# Patient Record
Sex: Male | Born: 1995 | Race: White | Hispanic: No | Marital: Single | Smoking: Never smoker
Health system: Southern US, Community
[De-identification: ages and names within clinical notes are randomized; demographics above are authoritative.]

## PROBLEM LIST (undated history)

## (undated) DIAGNOSIS — F79 Unspecified intellectual disabilities: Secondary | ICD-10-CM

## (undated) DIAGNOSIS — I517 Cardiomegaly: Secondary | ICD-10-CM

## (undated) DIAGNOSIS — IMO0001 Reserved for inherently not codable concepts without codable children: Secondary | ICD-10-CM

## (undated) HISTORY — PX: CLEFT PALATE REPAIR: SUR1165

## (undated) HISTORY — PX: MOUTH SURGERY: SHX715

---
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
## 2015-03-01 NOTE — H&P (Signed)
HISTORY AND PHYSICAL  Ruben Mack is a 19 y.o. male patient referred by pediatric dentist for removal impacted third molars.  No diagnosis found.  No past medical history on file.  No current facility-administered medications for this encounter.   No current outpatient prescriptions on file.   Allergies not on file Active Problems:   * No active hospital problems. *  Vitals: There were no vitals taken for this visit. Lab results:No results found for this or any previous visit (from the past 24 hour(s)). Radiology Results: No results found. General appearance: alert, cooperative, no distress and slowed mentation Head: Normocephalic, without obvious abnormality, atraumatic Eyes: negative Nose: Nares normal. Septum midline. Mucosa normal. No drainage or sinus tenderness. Throat: lips, mucosa, and tongue normal; teeth and gums normal and impacted teeth # 1, 15, 16, 17, 32 Neck: no adenopathy, supple, symmetrical, trachea midline and thyroid not enlarged, symmetric, no tenderness/mass/nodules Resp: clear to auscultation bilaterally Cardio: regular rate and rhythm, S1, S2 normal, no murmur, click, rub or gallop  Assessment:Impacted teeth # 1, 15, 16, 17, 32. Plan: Extraction impacted teeth # 1, 16, 17, 32. Surgical exposure tooth # 15. General anesthesia. Day surgery.   [name] 03/01/2015

## 2015-03-02 ENCOUNTER — Encounter (HOSPITAL_COMMUNITY): Payer: Self-pay | Admitting: *Deleted

## 2015-03-02 MED ORDER — CEFAZOLIN SODIUM-DEXTROSE 2-3 GM-% IV SOLR
2.0000 g | INTRAVENOUS | Status: AC
  Administered 2015-03-03: 2 g via INTRAVENOUS
  Filled 2015-03-02: qty 50

## 2015-03-02 NOTE — Progress Notes (Signed)
Pt SDW -pre-op call completed by pt mother, Elease Hashimoto [facility] documentation on chart). Mother denies pt having a stress test and cardiac cath but stated that an EKG was done at [facility] in [place] where pt is treated ( physician name unknown); records requested. Pt mother made aware to stop administering  Aspirin, otc vitamins and herbal medications. Do not give any NSAIDs ie: Ibuprofen, Advil, Naproxen or any medication containing Aspirin. Mother verbalized understanding of all pre-op instructions. Pt chart forwarded to [place], [place], anesthesia, to review cardiac history and history of intellectual development delay.

## 2015-03-02 NOTE — Progress Notes (Signed)
Anesthesia Chart Review: SAME DAY WORK-UP.  Patient is a 19 year old male scheduled for wisdom teeth extraction tomorrow by Dr. Barbette Merino.  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  His pediatrician is Dr. Michiel Sites with [facility].  Reported Dr. Barbette Merino spoke with him regarding plans for surgery, and Dr. Eddie Candle preferred procedure be done in a hospital setting.  Cardiologist is Dr. Cristy Folks ([facility]; see Care Everywhere). He sees patient yearly given patient's LVH and strong family history of HCM. Last visit 08/10/14. Patient was doing well at that time.  There was consideration of an eventual referral to a genetic cardiologist "at some point." He did order a holter monitor (see below) that was reassuring. Based on exam findings then, Dr. Meredeth Ide recommended: 1. No changes to usual care are necessary. 2. Self limiting restrictions to sports or activities at this time. 3. Antibiotic prophylaxis is not needed prior to dental procedures.   08/10/14 EKG ([facility]): NSR.  09/2014 24 hour Holter Monitor: Rhythm: Normal sinus rhythm with periods of sinus tachycardia, sinus bradycardia, and sinus arrhythmia. There was one isolated supraventricular ectopic beats. There were no supraventricular or ventricular runs. Heart range and variability: minimum heart rate 42, average heart rate 68, and maximum heart rate of 119 bpm. There were no prolonged pauses and No evidence of AV block. The longest R-R interval was 1.55 s Recommendations: 1. The results are reassuring  2. Follow up has been recommended as previously planned. I'd be happy to see them sooner if needed.  08/10/14 Echo ([facility]; see Care Everywhere): INTERPRETATION SUMMARY No significant change from last echocardiogram.  Normal left ventricular systolic function Normal right ventricular  systolic function Borderline concentric left ventricular hypertrophy. The left ventricle appears more thickened toward the apex with trabeculations.  06/06/10 Cardiac MRI ([facility]; See Care Everywhere): FINAL IMPRESSION: Normal LV function and volume. Top normal to mild concentric left ventricular hypertrophy. No evidence of ischemia on stress perfusion imaging. No evidence of dynamic LV outflow tract obstruction. The pulse sequences used were designed for imaging cardiovascular structures and are suboptimal for imaging other structures and organs.  Above history reviewed with anesthesiologist Dr. Katrinka Blazing. Patient has had cardiology follow-up with testing within the past year that were stable.  If no acute changes it is anticipated that he can proceed as planned.  Velna Ochs [facility] Short Stay Center/Anesthesiology Phone [phone] 03/02/2015 4:56 PM

## 2015-03-03 ENCOUNTER — Ambulatory Visit (HOSPITAL_COMMUNITY): Payer: Medicaid Other | Admitting: Vascular Surgery

## 2015-03-03 ENCOUNTER — Encounter (HOSPITAL_COMMUNITY)
Admission: RE | Disposition: A | Discharge status: Home / Self Care | Payer: Self-pay | Source: Ambulatory Visit | Attending: Oral Surgery

## 2015-03-03 ENCOUNTER — Encounter (HOSPITAL_COMMUNITY): Payer: Self-pay

## 2015-03-03 ENCOUNTER — Ambulatory Visit (HOSPITAL_COMMUNITY)
Admission: RE | Admit: 2015-03-03 | Discharge: 2015-03-03 | Disposition: A | Discharge status: Home / Self Care | Payer: Medicaid Other | Source: Ambulatory Visit | Attending: Oral Surgery | Admitting: Oral Surgery

## 2015-03-03 DIAGNOSIS — K011 Impacted teeth: Secondary | ICD-10-CM | POA: Insufficient documentation

## 2015-03-03 DIAGNOSIS — F840 Autistic disorder: Secondary | ICD-10-CM | POA: Diagnosis not present

## 2015-03-03 DIAGNOSIS — R6250 Unspecified lack of expected normal physiological development in childhood: Secondary | ICD-10-CM | POA: Insufficient documentation

## 2015-03-03 HISTORY — DX: Unspecified intellectual disabilities: F79

## 2015-03-03 HISTORY — DX: Reserved for inherently not codable concepts without codable children: IMO0001

## 2015-03-03 HISTORY — PX: TOOTH EXTRACTION: SHX859

## 2015-03-03 HISTORY — DX: Cardiomegaly: I51.7

## 2015-03-03 LAB — BASIC METABOLIC PANEL
Anion gap: 12 (ref 5–15)
BUN: <5 mg/dL — ABNORMAL LOW (ref 6–20)
CO2: 20 mmol/L — ABNORMAL LOW (ref 22–32)
Calcium: 9.4 mg/dL (ref 8.9–10.3)
Chloride: 103 mmol/L (ref 101–111)
Creatinine, Ser: 0.64 mg/dL (ref 0.61–1.24)
GFR calc non Af Amer: >60 mL/min (ref >60)
Glucose, Bld: 106 mg/dL — ABNORMAL HIGH (ref 65–99)
Potassium: 3.8 mmol/L (ref 3.5–5.1)
Sodium: 135 mmol/L (ref 135–145)

## 2015-03-03 LAB — CBC
HCT: 41.5 % (ref 39.0–52.0)
HEMOGLOBIN: 14.3 g/dL (ref 13.0–17.0)
MCH: 30 pg (ref 26.0–34.0)
MCHC: 34.5 g/dL (ref 30.0–36.0)
MCV: 87.2 fL (ref 78.0–100.0)
PLATELETS: 254 10*3/uL (ref 150–400)
RBC: 4.76 MIL/uL (ref 4.22–5.81)
RDW: 14.2 % (ref 11.5–15.5)
WBC: 6.6 10*3/uL (ref 4.0–10.5)

## 2015-03-03 SURGERY — DENTAL RESTORATION/EXTRACTIONS
Anesthesia: General | Site: Mouth

## 2015-03-03 MED ORDER — PROMETHAZINE HCL 25 MG/ML IJ SOLN: 6.2500 mg | INTRAMUSCULAR | Status: DC | PRN

## 2015-03-03 MED ORDER — FENTANYL CITRATE (PF) 100 MCG/2ML IJ SOLN
INTRAMUSCULAR | Status: DC | PRN
  Administered 2015-03-03 (×2): 50 ug via INTRAVENOUS

## 2015-03-03 MED ORDER — OXYCODONE-ACETAMINOPHEN 5-325 MG PO TABS: 1.0000 | ORAL_TABLET | ORAL | Status: DC | PRN

## 2015-03-03 MED ORDER — SUCCINYLCHOLINE CHLORIDE 20 MG/ML IJ SOLN
INTRAMUSCULAR | Status: DC | PRN
  Administered 2015-03-03: 100 mg via INTRAVENOUS

## 2015-03-03 MED ORDER — 0.9 % SODIUM CHLORIDE (POUR BTL) OPTIME
TOPICAL | Status: DC | PRN
  Administered 2015-03-03: 1000 mL

## 2015-03-03 MED ORDER — LIDOCAINE-EPINEPHRINE 2 %-1:100000 IJ SOLN
INTRAMUSCULAR | Status: AC
  Filled 2015-03-03: qty 1

## 2015-03-03 MED ORDER — SODIUM CHLORIDE 0.9 % IR SOLN
Status: DC | PRN
  Administered 2015-03-03: 1000 mL

## 2015-03-03 MED ORDER — MIDAZOLAM HCL 2 MG/2ML IJ SOLN
INTRAMUSCULAR | Status: AC
  Filled 2015-03-03: qty 2

## 2015-03-03 MED ORDER — LIDOCAINE-EPINEPHRINE 2 %-1:100000 IJ SOLN
INTRAMUSCULAR | Status: DC | PRN
  Administered 2015-03-03: 15 mL

## 2015-03-03 MED ORDER — FENTANYL CITRATE (PF) 250 MCG/5ML IJ SOLN
INTRAMUSCULAR | Status: AC
  Filled 2015-03-03: qty 5

## 2015-03-03 MED ORDER — PROPOFOL 10 MG/ML IV BOLUS
INTRAVENOUS | Status: AC
  Filled 2015-03-03: qty 20

## 2015-03-03 MED ORDER — PROPOFOL 10 MG/ML IV BOLUS
INTRAVENOUS | Status: DC | PRN
  Administered 2015-03-03: 200 mg via INTRAVENOUS

## 2015-03-03 MED ORDER — MIDAZOLAM HCL 2 MG/ML PO SYRP
12.0000 mg | ORAL_SOLUTION | Freq: Once | ORAL | Status: AC
  Administered 2015-03-03: 12 mg via ORAL

## 2015-03-03 MED ORDER — ONDANSETRON HCL 4 MG/2ML IJ SOLN
INTRAMUSCULAR | Status: DC | PRN
  Administered 2015-03-03: 4 mg via INTRAVENOUS

## 2015-03-03 MED ORDER — LIDOCAINE HCL (CARDIAC) 20 MG/ML IV SOLN
INTRAVENOUS | Status: DC | PRN
  Administered 2015-03-03: 50 mg via INTRAVENOUS

## 2015-03-03 MED ORDER — MIDAZOLAM HCL 2 MG/ML PO SYRP
ORAL_SOLUTION | ORAL | Status: AC
  Filled 2015-03-03: qty 6

## 2015-03-03 MED ORDER — HYDROMORPHONE HCL 1 MG/ML IJ SOLN: 0.2500 mg | INTRAMUSCULAR | Status: DC | PRN

## 2015-03-03 MED ORDER — DEXAMETHASONE SODIUM PHOSPHATE 4 MG/ML IJ SOLN
INTRAMUSCULAR | Status: DC | PRN
  Administered 2015-03-03: 8 mg via INTRAVENOUS

## 2015-03-03 MED ORDER — LACTATED RINGERS IV SOLN
INTRAVENOUS | Status: DC
Start: 2015-03-03 — End: 2015-03-03
  Administered 2015-03-03: 07:00:00 via INTRAVENOUS

## 2015-03-03 SURGICAL SUPPLY — 29 items
BLADE 10 SAFETY STRL DISP (BLADE) ×3 IMPLANT
BUR CROSS CUT FISSURE 1.6 (BURR) ×1 IMPLANT
BUR CROSS CUT FISSURE 1.6MM (BURR) ×1
BUR EGG ELITE 4.0 (BURR) ×1 IMPLANT
BUR EGG ELITE 4.0MM (BURR)
CANISTER SUCTION 2500CC (MISCELLANEOUS) ×3 IMPLANT
COVER SURGICAL LIGHT HANDLE (MISCELLANEOUS) ×3 IMPLANT
GAUZE PACKING FOLDED 2  STR (GAUZE/BANDAGES/DRESSINGS) ×2
GAUZE PACKING FOLDED 2 STR (GAUZE/BANDAGES/DRESSINGS) ×1 IMPLANT
GLOVE BIO SURGEON STRL SZ 6.5 (GLOVE) ×3 IMPLANT
GLOVE BIO SURGEON STRL SZ7.5 (GLOVE) ×7 IMPLANT
GLOVE BIO SURGEONS STRL SZ 6.5 (GLOVE) ×2
GLOVE BIOGEL PI IND STRL 7.0 (GLOVE) ×1 IMPLANT
GLOVE BIOGEL PI INDICATOR 7.0 (GLOVE) ×4
GOWN STRL REUS W/ TWL LRG LVL3 (GOWN DISPOSABLE) ×1 IMPLANT
GOWN STRL REUS W/ TWL XL LVL3 (GOWN DISPOSABLE) ×1 IMPLANT
GOWN STRL REUS W/TWL LRG LVL3 (GOWN DISPOSABLE) ×6
GOWN STRL REUS W/TWL XL LVL3 (GOWN DISPOSABLE) ×3
KIT BASIN OR (CUSTOM PROCEDURE TRAY) ×3 IMPLANT
KIT ROOM TURNOVER OR (KITS) ×3 IMPLANT
NEEDLE 22X1 1/2 (OR ONLY) (NEEDLE) ×3 IMPLANT
NS IRRIG 1000ML POUR BTL (IV SOLUTION) ×3 IMPLANT
PAD ARMBOARD 7.5X6 YLW CONV (MISCELLANEOUS) ×3 IMPLANT
SPONGE SURGIFOAM ABS GEL 12-7 (HEMOSTASIS) IMPLANT
SUT CHROMIC 3 0 PS 2 (SUTURE) ×2 IMPLANT
TOWEL OR 17X24 6PK STRL BLUE (TOWEL DISPOSABLE) ×3 IMPLANT
TRAY ENT MC OR (CUSTOM PROCEDURE TRAY) ×3 IMPLANT
TUBING IRRIGATION (MISCELLANEOUS) ×3 IMPLANT
YANKAUER SUCT BULB TIP NO VENT (SUCTIONS) ×3 IMPLANT

## 2015-03-03 NOTE — Anesthesia Preprocedure Evaluation (Addendum)
Anesthesia Evaluation  Patient identified by MRN, date of birth, ID band Patient awake    Reviewed: Allergy & Precautions, NPO status , Patient's Chart, lab work & pertinent test results  Airway Mallampati: II  TM Distance: >3 FB Neck ROM: Full    Dental  (+) Dental Advisory Given, Poor Dentition   Pulmonary shortness of breath and with exertion,    Pulmonary exam normal       Cardiovascular Normal cardiovascular exam Cardiomyopathy   Neuro/Psych Mental Disability negative psych ROS   GI/Hepatic negative GI ROS, Neg liver ROS,   Endo/Other  negative endocrine ROS  Renal/GU negative Renal ROS  negative genitourinary   Musculoskeletal negative musculoskeletal ROS (+)   Abdominal   Peds negative pediatric ROS (+)  Hematology negative hematology ROS (+)   Anesthesia Other Findings   Reproductive/Obstetrics negative OB ROS                            Anesthesia Physical Anesthesia Plan  ASA: II  Anesthesia Plan: General   Post-op Pain Management:    Induction: Intravenous  Airway Management Planned: Oral ETT  Additional Equipment:   Intra-op Plan:   Post-operative Plan: Extubation in OR  Informed Consent: I have reviewed the patients History and Physical, chart, labs and discussed the procedure including the risks, benefits and alternatives for the proposed anesthesia with the patient or authorized representative who has indicated his/her understanding and acceptance.   Dental advisory given  Plan Discussed with:   Anesthesia Plan Comments:        Anesthesia Quick Evaluation

## 2015-03-03 NOTE — Transfer of Care (Signed)
Immediate Anesthesia Transfer of Care Note  Patient: Ruben Mack  Procedure(s) Performed: Procedure(s): WISDOM EXTRACTIONS (N/A)  Patient Location: PACU  Anesthesia Type:General  Level of Consciousness: awake, alert , oriented, patient cooperative and responds to stimulation  Airway & Oxygen Therapy: Patient Spontanous Breathing and Patient connected to nasal cannula oxygen  Post-op Assessment: Report given to RN, Post -op Vital signs reviewed and stable, Patient moving all extremities X 4 and Patient able to stick tongue midline  Post vital signs: stable  Last Vitals:  Filed Vitals:   03/03/15 0637  BP: 126/69  Pulse: 81  Temp: 36.4 C  Resp: 20    Complications: No apparent anesthesia complications

## 2015-03-03 NOTE — Anesthesia Postprocedure Evaluation (Signed)
Anesthesia Post Note  Patient: Ruben Mack  Procedure(s) Performed: Procedure(s) (LRB): WISDOM EXTRACTIONS (N/A)  Anesthesia type: general  Patient location: PACU  Post pain: Pain level controlled  Post assessment: Patient's Cardiovascular Status Stable  Last Vitals:  Filed Vitals:   03/03/15 1015  BP: 132/88  Pulse: 91  Temp:   Resp: 27    Post vital signs: Reviewed and stable  Level of consciousness: sedated  Complications: No apparent anesthesia complications

## 2015-03-03 NOTE — Op Note (Signed)
03/03/2015  9:34 AM  PATIENT:  Ruben Mack  19 y.o. male  PRE-OPERATIVE DIAGNOSIS:   IMPACTED TEETH # 1, 16, 17, 32  POST-OPERATIVE DIAGNOSIS:  SAME  PROCEDURE:  Procedure(s): EXTRACTION TEETH # 1, 16, 17, 32:   SURGEON:  Surgeon(s): Press photographer, DDS  ANESTHESIA:   local and general  EBL:  minimal  DRAINS: none   SPECIMEN:  No Specimen  COUNTS:  YES  PLAN OF CARE: Discharge to home after PACU  PATIENT DISPOSITION:  PACU - hemodynamically stable.   PROCEDURE DETAILS: Dictation # [id number] [name], DMD 03/03/2015 9:34 AM

## 2015-03-03 NOTE — H&P (Signed)
Anesthesia H&P Update: History and Physical Exam reviewed; patient is OK for planned anesthetic and procedure. ? ?

## 2015-03-03 NOTE — Anesthesia Procedure Notes (Signed)
Procedure Name: Intubation Date/Time: 03/03/2015 8:57 AM Performed by: Marylyn Ishihara Pre-anesthesia Checklist: Patient identified, Timeout performed, Emergency Drugs available, Suction available and Patient being monitored Patient Re-evaluated:Patient Re-evaluated prior to inductionOxygen Delivery Method: Circle system utilized Preoxygenation: Pre-oxygenation with 100% oxygen Intubation Type: IV induction Ventilation: Mask ventilation without difficulty Laryngoscope Size: Mac and 3 Tube type: Oral Tube size: 7.0 mm Number of attempts: 1 Airway Equipment and Method: Stylet Placement Confirmation: ETT inserted through vocal cords under direct vision,  breath sounds checked- equal and bilateral and positive ETCO2 Secured at: 20.5 cm Tube secured with: Tape Dental Injury: Teeth and Oropharynx as per pre-operative assessment

## 2015-03-03 NOTE — H&P (Signed)
H&P documentation  -History and Physical Reviewed  -Patient has been re-examined  -No change in the plan of care  Ruben Mack M  

## 2015-03-04 NOTE — Op Note (Signed)
NAME:  Ruben Mack, Ruben Mack NO.:  [id number]  MEDICAL RECORD NO.:  [id number]  LOCATION:  [facility]                         FACILITY:  [facility]  PHYSICIAN:  [name], M.D.  DATE OF BIRTH:  1996-01-11  DATE OF PROCEDURE:  03/03/2015 DATE OF DISCHARGE:  03/03/2015                              OPERATIVE REPORT   PREOPERATIVE DIAGNOSIS:  Impacted wisdom teeth numbers 1, 16, 17, 32.  POSTOPERATIVE DIAGNOSIS:  Impacted wisdom teeth numbers 1, 16, 17, 32.  PROCEDURE:  Extraction of teeth numbers 1, 16, 17, 32.  Exposure impacted tooth #15.  SURGEON:  [name], M.D.  ANESTHESIA:  General.  Dr. Krista Blue attending.  Nasal intubation.  DESCRIPTION OF PROCEDURE:  The patient was taken to the operating room, placed on the table in supine position.  General anesthesia was administered and an oral endotracheal tube was placed and secured.  The eyes were protected and the patient was draped for the procedure.  Time- out was performed.  The posterior pharynx was suctioned.  Throat pack was placed.  A 2% lidocaine with 1:100,000 epinephrine was infiltrated in an inferior alveolar block on the right and left side and buccal and palatal infiltration in the maxilla in the posterior aspect.  A total of 12 mL was utilized.  The left side was operated first.  A  #15 blade was used to make an incision overlying tooth #17 and 16 sparing the papilla and both the maxilla and mandible on the medial aspect of the incision. The periosteum was reflected with a periosteal elevator.  Bone was removed with a Stryker handpiece in the mandible and tooth #17 was sectioned and removed with a 301 elevator and the socket was then curetted and closed with 3-0 chromic and the maxilla tooth #16 was removed.  It was found to be partially in the bone and was preventing interruption of unerupted tooth #15.  Tooth #15 was elevated gently with a 301 elevator and then the area was curetted and closed with  3-0 chromic.  Then, the endotracheal tube was repositioned to the other side of the mouth; however, upon doing this, the patient was inadvertently extubated.  This was recognized by the CRNA and then the throat pack was removed and the endotracheal tube was removed.  The patient was reintubated.  His O2 sat did not drop below 90 throughout this event. The patient being reintubated and the operation continued.  Right side was operated using a #15 blade to make an incision overlying teeth numbers 1 and 32.  The periosteum was reflected and then bone was removed using the Stryker handpiece overlying tooth #32 and the tooth was sectioned and removed with a 301 elevator.  The sockets were curetted, irrigated and closed with 3-0 chromic.  At tooth #16 was removed using the Cryer elevators after bone was removed using Stryker handpiece.  Then, this socket was curetted, irrigated, and closed with 3- 0 chromic.  The oral cavity was then suctioned.  After irrigating, the throat pack was removed.  The patient was awakened and taken to the recovery room, breathing spontaneously in good condition.  ESTIMATED BLOOD LOSS:  Minimal.  COMPLICATIONS:  None.     [name], M.D.     SMJ/MEDQ  D:  03/03/2015  T:  03/04/2015  Job:  [id number]

## 2015-03-06 ENCOUNTER — Encounter (HOSPITAL_COMMUNITY): Payer: Self-pay | Admitting: Oral Surgery

## 2015-08-21 ENCOUNTER — Emergency Department (INDEPENDENT_AMBULATORY_CARE_PROVIDER_SITE_OTHER)
Admission: EM | Admit: 2015-08-21 | Discharge: 2015-08-21 | Disposition: A | Discharge status: Home / Self Care | Payer: Medicaid Other | Source: Home / Self Care | Attending: Family Medicine | Admitting: Family Medicine

## 2015-08-21 ENCOUNTER — Encounter (HOSPITAL_COMMUNITY): Payer: Self-pay | Admitting: Emergency Medicine

## 2015-08-21 DIAGNOSIS — R05 Cough: Secondary | ICD-10-CM | POA: Diagnosis not present

## 2015-08-21 DIAGNOSIS — J3489 Other specified disorders of nose and nasal sinuses: Secondary | ICD-10-CM | POA: Diagnosis not present

## 2015-08-21 DIAGNOSIS — J069 Acute upper respiratory infection, unspecified: Secondary | ICD-10-CM

## 2015-08-21 DIAGNOSIS — R059 Cough, unspecified: Secondary | ICD-10-CM

## 2015-08-21 LAB — POCT RAPID STREP A: STREPTOCOCCUS, GROUP A SCREEN (DIRECT): NEGATIVE

## 2015-08-21 NOTE — ED Notes (Signed)
Sore throat and chest congestion for 2 days.  Caregiver with similar symptoms.  Guaifenesin and tylenol taking for symptoms.

## 2015-08-21 NOTE — Discharge Instructions (Signed)
Cough, Adult The primary reason for your cough is the drainage in the back of your throat. Treatment for that includes increasing her fluid intake and taking in any histamine. Would recommend taking either Allegra or Zyrtec for drainage. You may also take Robitussin-DM for cough. Coughing is a reflex that clears your throat and your airways. Coughing helps to heal and protect your lungs. It is normal to cough occasionally, but a cough that happens with other symptoms or lasts a long time may be a sign of a condition that needs treatment. A cough may last only 2-3 weeks (acute), or it may last longer than 8 weeks (chronic). CAUSES Coughing is commonly caused by:  Breathing in substances that irritate your lungs.  A viral or bacterial respiratory infection.  Allergies.  Asthma.  Postnasal drip.  Smoking.  Acid backing up from the stomach into the esophagus (gastroesophageal reflux).  Certain medicines.  Chronic lung problems, including COPD (or rarely, lung cancer).  Other medical conditions such as heart failure. HOME CARE INSTRUCTIONS  Pay attention to any changes in your symptoms. Take these actions to help with your discomfort:  Take medicines only as told by your health care provider.  If you were prescribed an antibiotic medicine, take it as told by your health care provider. Do not stop taking the antibiotic even if you start to feel better.  Talk with your health care provider before you take a cough suppressant medicine.  Drink enough fluid to keep your urine clear or pale yellow.  If the air is dry, use a cold steam vaporizer or humidifier in your bedroom or your home to help loosen secretions.  Avoid anything that causes you to cough at work or at home.  If your cough is worse at night, try sleeping in a semi-upright position.  Avoid cigarette smoke. If you smoke, quit smoking. If you need help quitting, ask your health care provider.  Avoid caffeine.  Avoid  alcohol.  Rest as needed. SEEK MEDICAL CARE IF:   You have new symptoms.  You cough up pus.  Your cough does not get better after 2-3 weeks, or your cough gets worse.  You cannot control your cough with suppressant medicines and you are losing sleep.  You develop pain that is getting worse or pain that is not controlled with pain medicines.  You have a fever.  You have unexplained weight loss.  You have night sweats. SEEK IMMEDIATE MEDICAL CARE IF:  You cough up blood.  You have difficulty breathing.  Your heartbeat is very fast.   This information is not intended to replace advice given to you by your health care provider. Make sure you discuss any questions you have with your health care provider.   Document Released: 02/01/2011 Document Revised: 04/26/2015 Document Reviewed: 10/12/2014 Elsevier Interactive Patient Education 2016 [employer]  Upper Respiratory Infection, Adult Most upper respiratory infections (URIs) are a viral infection of the air passages leading to the lungs. A URI affects the nose, throat, and upper air passages. The most common type of URI is nasopharyngitis and is typically referred to as "the common cold." URIs run their course and usually go away on their own. Most of the time, a URI does not require medical attention, but sometimes a bacterial infection in the upper airways can follow a viral infection. This is called a secondary infection. Sinus and middle ear infections are common types of secondary upper respiratory infections. Bacterial pneumonia can also complicate a URI. A  URI can worsen asthma and chronic obstructive pulmonary disease (COPD). Sometimes, these complications can require emergency medical care and may be life threatening.  CAUSES Almost all URIs are caused by viruses. A virus is a type of germ and can spread from one person to another.  RISKS FACTORS You may be at risk for a URI if:   You smoke.   You have chronic heart  or lung disease.  You have a weakened defense (immune) system.   You are very young or very old.   You have nasal allergies or asthma.  You work in crowded or poorly ventilated areas.  You work in health care facilities or schools. SIGNS AND SYMPTOMS  Symptoms typically develop 2-3 days after you come in contact with a cold virus. Most viral URIs last 7-10 days. However, viral URIs from the influenza virus (flu virus) can last 14-18 days and are typically more severe. Symptoms may include:   Runny or stuffy (congested) nose.   Sneezing.   Cough.   Sore throat.   Headache.   Fatigue.   Fever.   Loss of appetite.   Pain in your forehead, behind your eyes, and over your cheekbones (sinus pain).  Muscle aches.  DIAGNOSIS  Your health care provider may diagnose a URI by:  Physical exam.  Tests to check that your symptoms are not due to another condition such as:  Strep throat.  Sinusitis.  Pneumonia.  Asthma. TREATMENT  A URI goes away on its own with time. It cannot be cured with medicines, but medicines may be prescribed or recommended to relieve symptoms. Medicines may help:  Reduce your fever.  Reduce your cough.  Relieve nasal congestion. HOME CARE INSTRUCTIONS   Take medicines only as directed by your health care provider.   Gargle warm saltwater or take cough drops to comfort your throat as directed by your health care provider.  Use a warm mist humidifier or inhale steam from a shower to increase air moisture. This may make it easier to breathe.  Drink enough fluid to keep your urine clear or pale yellow.   Eat soups and other clear broths and maintain good nutrition.   Rest as needed.   Return to work when your temperature has returned to normal or as your health care provider advises. You may need to stay home longer to avoid infecting others. You can also use a face mask and careful hand washing to prevent spread of the  virus.  Increase the usage of your inhaler if you have asthma.   Do not use any tobacco products, including cigarettes, chewing tobacco, or electronic cigarettes. If you need help quitting, ask your health care provider. PREVENTION  The best way to protect yourself from getting a cold is to practice good hygiene.   Avoid oral or hand contact with people with cold symptoms.   Wash your hands often if contact occurs.  There is no clear evidence that vitamin C, vitamin E, echinacea, or exercise reduces the chance of developing a cold. However, it is always recommended to get plenty of rest, exercise, and practice good nutrition.  SEEK MEDICAL CARE IF:   You are getting worse rather than better.   Your symptoms are not controlled by medicine.   You have chills.  You have worsening shortness of breath.  You have brown or red mucus.  You have yellow or brown nasal discharge.  You have pain in your face, especially when you bend forward.  You  have a fever.  You have swollen neck glands.  You have pain while swallowing.  You have white areas in the back of your throat. SEEK IMMEDIATE MEDICAL CARE IF:   You have severe or persistent:  Headache.  Ear pain.  Sinus pain.  Chest pain.  You have chronic lung disease and any of the following:  Wheezing.  Prolonged cough.  Coughing up blood.  A change in your usual mucus.  You have a stiff neck.  You have changes in your:  Vision.  Hearing.  Thinking.  Mood. MAKE SURE YOU:   Understand these instructions.  Will watch your condition.  Will get help right away if you are not doing well or get worse.   This information is not intended to replace advice given to you by your health care provider. Make sure you discuss any questions you have with your health care provider.   Document Released: 01/29/2001 Document Revised: 12/20/2014 Document Reviewed: 11/10/2013 Elsevier Interactive Patient Education  [employer]

## 2015-08-21 NOTE — ED Provider Notes (Signed)
CSN: [id number]     Arrival date & time 08/21/15  1309 History   First MD Initiated Contact with Patient 08/21/15 1437     Chief Complaint  Patient presents with  . Sore Throat   (Consider location/radiation/quality/duration/timing/severity/associated sxs/prior Treatment) HPI Comments: 20 year old male complaining of sore throat, cough, runny nose for 2-3 days. Denies PND or fever. He has been taking OTC antihistamines with minimal relief. He and his mother are being seen together and are requesting strep test. These tests are negative.   Past Medical History  Diagnosis Date  . 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 Cristy Folks ([facility])  . Mental disability     intellectual  . Shortness of breath dyspnea     uses inhaler   Past Surgical History  Procedure Laterality Date  . Mouth surgery      palate adjustment  . Tooth extraction N/A 03/03/2015    Procedure: WISDOM EXTRACTIONS;  Surgeon: Ocie Doyne, DDS;  Location: [facility] OR;  Service: Oral Surgery;  Laterality: N/A;   Family History  Problem Relation Age of Onset  . Cancer Other    Social History  Substance Use Topics  . Smoking status: Never Smoker   . Smokeless tobacco: Never Used  . Alcohol Use: No    Review of Systems  Constitutional: Negative for fever, activity change and fatigue.  HENT: Positive for congestion and sore throat. Negative for ear pain and hearing loss.   Respiratory: Positive for cough. Negative for shortness of breath.   Cardiovascular: Negative.   Gastrointestinal: Negative.   Musculoskeletal: Negative.   Skin: Negative.     Allergies  Review of patient's allergies indicates no known allergies.  Home Medications   Prior to Admission medications   Medication Sig Start Date End Date Taking? Authorizing Provider  acetaminophen (TYLENOL) 325 MG tablet Take 650 mg by mouth every 6 (six) hours as needed.    Yes Historical Provider, MD  GUAIFENESIN PO Take by mouth.   Yes Historical Provider, MD  cloNIDine (CATAPRES) 0.2 MG tablet Take 0.2 mg by mouth every evening.    Historical Provider, MD  oxyCODONE-acetaminophen (PERCOCET) 5-325 MG per tablet Take 1-2 tablets by mouth every 4 (four) hours as needed. 03/03/15   Ocie Doyne, DDS   Meds Ordered and Administered this Visit  Medications - No data to display  BP 130/82 mmHg  Pulse 74  Temp(Src) 98.2 F (36.8 C) (Oral)  Resp 18  SpO2 100% No data found.   Physical Exam  Constitutional: He is oriented to person, place, and time. He appears well-developed and well-nourished. No distress.  HENT:  Mouth/Throat: No oropharyngeal exudate.  Bilateral TMs are primarily obscured by cerumen. Left TM is normal-appearing. Oropharynx with minor erythema, cobblestoning and moderate amount of clear PND.  Eyes: Conjunctivae and EOM are normal.  Neck: Normal range of motion. Neck supple.  Cardiovascular: Normal rate, regular rhythm and normal heart sounds.   Pulmonary/Chest: Effort normal and breath sounds normal. No respiratory distress. He has no wheezes. He has no rales.  Musculoskeletal: Normal range of motion. He exhibits no edema.  Lymphadenopathy:    He has no cervical adenopathy.  Neurological: He is alert and oriented to person, place, and time.  Skin: Skin is warm and dry. No rash noted.  Psychiatric: He has a normal mood and affect.  Nursing note and vitals reviewed.   ED Course  Procedures (  including critical care time)  Labs Review Labs Reviewed  POCT RAPID STREP A   Results for orders placed or performed during the hospital encounter of 08/21/15  POCT rapid strep A [facility])  Result Value Ref Range   Streptococcus, Group A Screen (Direct) NEGATIVE NEGATIVE     Imaging Review No results found.   Visual Acuity Review  Right Eye Distance:   Left Eye Distance:   Bilateral Distance:    Right Eye Near:   Left Eye  Near:    Bilateral Near:         MDM   1. URI (upper respiratory infection)   2. Sinus drainage   3. Cough    The primary reason for your cough is the drainage in the back of your throat. Treatment for that includes increasing her fluid intake and taking in any histamine. Would recommend taking either Allegra or Zyrtec for drainage. You may also take Robitussin-DM for cough.     Hayden Rasmussen, NP 08/21/15 1505  Hayden Rasmussen, NP 08/21/15 [phone]

## 2015-08-21 NOTE — ED Notes (Signed)
Caregiver and patient being treated in the same treatment room

## 2015-08-23 LAB — CULTURE, GROUP A STREP: Strep A Culture: NEGATIVE

## 2015-10-09 ENCOUNTER — Emergency Department (INDEPENDENT_AMBULATORY_CARE_PROVIDER_SITE_OTHER)
Admission: EM | Admit: 2015-10-09 | Discharge: 2015-10-09 | Disposition: A | Discharge status: Home / Self Care | Payer: Medicaid Other | Source: Home / Self Care | Attending: Family Medicine | Admitting: Family Medicine

## 2015-10-09 ENCOUNTER — Encounter (HOSPITAL_COMMUNITY): Payer: Self-pay | Admitting: Emergency Medicine

## 2015-10-09 DIAGNOSIS — J069 Acute upper respiratory infection, unspecified: Secondary | ICD-10-CM

## 2015-10-09 NOTE — Discharge Instructions (Signed)
Upper Respiratory Infection, Adult Most upper respiratory infections (URIs) are a viral infection of the air passages leading to the lungs. A URI affects the nose, throat, and upper air passages. The most common type of URI is nasopharyngitis and is typically referred to as "the common cold." URIs run their course and usually go away on their own. Most of the time, a URI does not require medical attention, but sometimes a bacterial infection in the upper airways can follow a viral infection. This is called a secondary infection. Sinus and middle ear infections are common types of secondary upper respiratory infections. Bacterial pneumonia can also complicate a URI. A URI can worsen asthma and chronic obstructive pulmonary disease (COPD). Sometimes, these complications can require emergency medical care and may be life threatening.  CAUSES Almost all URIs are caused by viruses. A virus is a type of germ and can spread from one person to another.  RISKS FACTORS You may be at risk for a URI if:   You smoke.   You have chronic heart or lung disease.  You have a weakened defense (immune) system.   You are very young or very old.   You have nasal allergies or asthma.  You work in crowded or poorly ventilated areas.  You work in health care facilities or schools. SIGNS AND SYMPTOMS  Symptoms typically develop 2-3 days after you come in contact with a cold virus. Most viral URIs last 7-10 days. However, viral URIs from the influenza virus (flu virus) can last 14-18 days and are typically more severe. Symptoms may include:   Runny or stuffy (congested) nose.   Sneezing.   Cough.   Sore throat.   Headache.   Fatigue.   Fever.   Loss of appetite.   Pain in your forehead, behind your eyes, and over your cheekbones (sinus pain).  Muscle aches.  DIAGNOSIS  Your health care provider may diagnose a URI by:  Physical exam.  Tests to check that your symptoms are not due to  another condition such as:  Strep throat.  Sinusitis.  Pneumonia.  Asthma. TREATMENT  A URI goes away on its own with time. It cannot be cured with medicines, but medicines may be prescribed or recommended to relieve symptoms. Medicines may help:  Reduce your fever.  Reduce your cough.  Relieve nasal congestion. HOME CARE INSTRUCTIONS   Take medicines only as directed by your health care provider.   Gargle warm saltwater or take cough drops to comfort your throat as directed by your health care provider.  Use a warm mist humidifier or inhale steam from a shower to increase air moisture. This may make it easier to breathe.  Drink enough fluid to keep your urine clear or pale yellow.   Eat soups and other clear broths and maintain good nutrition.   Rest as needed.   Return to work when your temperature has returned to normal or as your health care provider advises. You may need to stay home longer to avoid infecting others. You can also use a face mask and careful hand washing to prevent spread of the virus.  Increase the usage of your inhaler if you have asthma.   Do not use any tobacco products, including cigarettes, chewing tobacco, or electronic cigarettes. If you need help quitting, ask your health care provider. PREVENTION  The best way to protect yourself from getting a cold is to practice good hygiene.   Avoid oral or hand contact with people with cold   symptoms.   Wash your hands often if contact occurs.  There is no clear evidence that vitamin C, vitamin E, echinacea, or exercise reduces the chance of developing a cold. However, it is always recommended to get plenty of rest, exercise, and practice good nutrition.  SEEK MEDICAL CARE IF:   You are getting worse rather than better.   Your symptoms are not controlled by medicine.   You have chills.  You have worsening shortness of breath.  You have brown or red mucus.  You have yellow or brown nasal  discharge.  You have pain in your face, especially when you bend forward.  You have a fever.  You have swollen neck glands.  You have pain while swallowing.  You have white areas in the back of your throat. SEEK IMMEDIATE MEDICAL CARE IF:   You have severe or persistent:  Headache.  Ear pain.  Sinus pain.  Chest pain.  You have chronic lung disease and any of the following:  Wheezing.  Prolonged cough.  Coughing up blood.  A change in your usual mucus.  You have a stiff neck.  You have changes in your:  Vision.  Hearing.  Thinking.  Mood. MAKE SURE YOU:   Understand these instructions.  Will watch your condition.  Will get help right away if you are not doing well or get worse.   This information is not intended to replace advice given to you by your health care provider. Make sure you discuss any questions you have with your health care provider.   Document Released: 01/29/2001 Document Revised: 12/20/2014 Document Reviewed: 11/10/2013 Elsevier Interactive Patient Education 2016 [employer] Inc[employer]  

## 2015-10-09 NOTE — ED Provider Notes (Signed)
CSN: [id number]     Arrival date & time 10/09/15  1631 History   First MD Initiated Contact with Patient 10/09/15 1852     Chief Complaint  Patient presents with  . Cough  . Nasal Congestion   (Consider location/radiation/quality/duration/timing/severity/associated sxs/prior Treatment) HPI History obtained from patient:  Cough for 1 week, OTC meds not  Helping. No fever. Non smoker.  Past Medical History  Diagnosis Date  . 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 Cristy Folks ([facility])  . Mental disability     intellectual  . Shortness of breath dyspnea     uses inhaler   Past Surgical History  Procedure Laterality Date  . Mouth surgery      palate adjustment  . Tooth extraction N/A 03/03/2015    Procedure: WISDOM EXTRACTIONS;  Surgeon: Ocie Doyne, DDS;  Location: [facility] OR;  Service: Oral Surgery;  Laterality: N/A;   Family History  Problem Relation Age of Onset  . Cancer Other    Social History  Substance Use Topics  . Smoking status: Never Smoker   . Smokeless tobacco: Never Used  . Alcohol Use: No    Review of Systems Cough and cold symptoms, no fever vomiting, diarrhea  Allergies  Review of patient's allergies indicates no known allergies.  Home Medications   Prior to Admission medications   Medication Sig Start Date End Date Taking? Authorizing Provider  cloNIDine (CATAPRES) 0.2 MG tablet Take 0.2 mg by mouth every evening.   Yes Historical Provider, MD  acetaminophen (TYLENOL) 325 MG tablet Take 650 mg by mouth every 6 (six) hours as needed.    Historical Provider, MD  GUAIFENESIN PO Take by mouth.    Historical Provider, MD  oxyCODONE-acetaminophen (PERCOCET) 5-325 MG per tablet Take 1-2 tablets by mouth every 4 (four) hours as needed. 03/03/15   Ocie Doyne, DDS   Meds Ordered and Administered this Visit  Medications - No data to display  BP 122/81 mmHg  Pulse 85   Temp(Src) 97.9 F (36.6 C) (Oral)  Resp 18  SpO2 97% No data found.   Physical Exam NURSES NOTES AND VITAL SIGNS REVIEWED. CONSTITUTIONAL: Well developed, well nourished, no acute distress HEENT: normocephalic, atraumatic, right and left TM's are normal EYES: Conjunctiva normal NECK:normal ROM, supple, no adenopathy PULMONARY:No respiratory distress, normal effort, Lungs: CTAb/l, no wheezes, or increased work of breathing CARDIOVASCULAR: RRR, no murmur ABDOMEN: soft, ND, NT, +'ve BS MUSCULOSKELETAL: Normal ROM of all extremities,  SKIN: warm and dry without rash PSYCHIATRIC: Mood and affect, behavior are normal   ED Course  Procedures (including critical care time)  Labs Review Labs Reviewed - No data to display  Imaging Review No results found.   Visual Acuity Review  Right Eye Distance:   Left Eye Distance:   Bilateral Distance:    Right Eye Near:   Left Eye Near:    Bilateral Near:         MDM   1. URI (upper respiratory infection)    Patient is advised to continue home symptomatic treatment.  Patient is advised that if there are new or worsening symptoms or attend the emergency department, or contact primary care provider. Instructions of care provided discharged home in stable condition. Return to work/school note provided.  THIS NOTE WAS GENERATED USING A VOICE RECOGNITION SOFTWARE PROGRAM. ALL REASONABLE EFFORTS  WERE MADE TO PROOFREAD THIS DOCUMENT FOR ACCURACY.  Tharon Aquas, PA 10/09/15 2047

## 2015-10-09 NOTE — ED Notes (Signed)
The patient presented to the [facility] with a complaint of a cough and nasal congestion x 3 days.

## 2018-01-13 ENCOUNTER — Emergency Department (HOSPITAL_COMMUNITY)
Admission: EM | Admit: 2018-01-13 | Discharge: 2018-01-14 | Disposition: A | Discharge status: Home / Self Care | Payer: Medicaid Other | Attending: Emergency Medicine | Admitting: Emergency Medicine

## 2018-01-13 ENCOUNTER — Other Ambulatory Visit: Payer: Self-pay

## 2018-01-13 ENCOUNTER — Emergency Department (HOSPITAL_COMMUNITY): Payer: Medicaid Other

## 2018-01-13 DIAGNOSIS — F79 Unspecified intellectual disabilities: Secondary | ICD-10-CM | POA: Insufficient documentation

## 2018-01-13 DIAGNOSIS — F909 Attention-deficit hyperactivity disorder, unspecified type: Secondary | ICD-10-CM | POA: Diagnosis not present

## 2018-01-13 DIAGNOSIS — R079 Chest pain, unspecified: Secondary | ICD-10-CM | POA: Insufficient documentation

## 2018-01-13 LAB — BASIC METABOLIC PANEL
Anion gap: 11 (ref 5–15)
CO2: 25 mmol/L (ref 22–32)
CREATININE: 0.73 mg/dL (ref 0.61–1.24)
Calcium: 9.6 mg/dL (ref 8.9–10.3)
Chloride: 106 mmol/L (ref 101–111)
GFR calc Af Amer: >60 mL/min (ref >60)
GLUCOSE: 97 mg/dL (ref 65–99)
Potassium: 3.4 mmol/L — ABNORMAL LOW (ref 3.5–5.1)
Sodium: 142 mmol/L (ref 135–145)

## 2018-01-13 LAB — CBC
HCT: 44.4 % (ref 39.0–52.0)
Hemoglobin: 14.6 g/dL (ref 13.0–17.0)
MCH: 29.6 pg (ref 26.0–34.0)
MCHC: 32.9 g/dL (ref 30.0–36.0)
MCV: 89.9 fL (ref 78.0–100.0)
Platelets: 342 10*3/uL (ref 150–400)
RBC: 4.94 MIL/uL (ref 4.22–5.81)
RDW: 14.6 % (ref 11.5–15.5)
WBC: 9.2 10*3/uL (ref 4.0–10.5)

## 2018-01-13 LAB — I-STAT TROPONIN, ED: TROPONIN I, POC: 0 ng/mL (ref 0.00–0.08)

## 2018-01-13 MED ORDER — ONDANSETRON HCL 4 MG/2ML IJ SOLN
4.0000 mg | Freq: Once | INTRAMUSCULAR | Status: AC
  Administered 2018-01-14: 4 mg via INTRAVENOUS
  Filled 2018-01-13: qty 2

## 2018-01-13 MED ORDER — LACTATED RINGERS IV BOLUS
500.0000 mL | Freq: Once | INTRAVENOUS | Status: AC
  Administered 2018-01-14: 500 mL via INTRAVENOUS

## 2018-01-13 NOTE — ED Notes (Signed)
Family at bedside. 

## 2018-01-13 NOTE — ED Provider Notes (Signed)
[facility] EMERGENCY DEPARTMENT Provider Note   CSN: [id number] Arrival date & time: 01/13/18  2113     History   Chief Complaint Chief Complaint  Patient presents with  . Chest Pain  . Shortness of Breath    HPI Ruben Mack is a 22 y.o. male.  HPI  Patient is a 22 year old male with a past medical history of developmental delay as well as hypertrophic cardiomyopathy.  Patient is followed by [facility].  Patient had recent Holter monitor which showed no occult arrhythmias and has been stable without episodes of chest pain for some time, however tonight patient developed sudden onset of substernal chest pain with one episode of nausea and vomiting as well as diaphoresis.  Patient denies radiation, loss of consciousness, lightheadedness or dizziness.  Patient denies any recent illness, fevers, chills.  Patient has had one episode of uncontrollable coughing which has resolved this time.  On further conversation, patient states that he was actually running at the time of his onset of chest pain.  Patient denies having eaten anything just prior.  Patient states the pain is the same as the pain he experienced previously that led to his initial diagnosis.  Past Medical History:  Diagnosis Date  . 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 Cristy Folks ([facility])  . Mental disability    intellectual  . Shortness of breath dyspnea    uses inhaler    Patient Active Problem List   Diagnosis Date Noted  . Short stature 01/26/2013  . 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 MOUTH SURGERY     palate adjustment  . TOOTH EXTRACTION N/A 03/03/2015   Procedure: WISDOM EXTRACTIONS;  Surgeon: Ocie Doyne, DDS;  Location: [facility] OR;  Service: Oral Surgery;  Laterality: N/A;        Home Medications    Prior to Admission medications   Medication Sig Start Date End Date Taking? Authorizing Provider  oxyCODONE-acetaminophen (PERCOCET) 5-325 MG per tablet Take 1-2 tablets by mouth every 4 (four) hours as needed. Patient not taking: Reported on 01/13/2018 03/03/15   Ocie Doyne, DDS    Family History Family History  Problem Relation Age of Onset  . Cancer Other     Social History Social History   Tobacco Use  . Smoking status: Never Smoker  . Smokeless tobacco: Never Used  Substance Use Topics  . Alcohol use: No  . Drug use: No     Allergies   Patient has no known allergies.   Review of Systems Review of Systems  Constitutional: Negative for chills, fatigue and fever.  Respiratory: Positive for cough and shortness of breath.   Cardiovascular: Positive for chest pain.  Gastrointestinal: Positive for nausea and vomiting.  All other systems reviewed and are negative.    Physical Exam Updated Vital Signs BP (!) 115/92   Pulse 85   Temp 98.7 F (37.1 C) (Oral)   Resp 17   Ht 5\' 1"  (1.549 m)   Wt 49.9 kg (110 lb)   SpO2 100%   BMI 20.78 kg/m   Physical Exam  Constitutional: He appears well-developed and well-nourished.  HENT:  Head: Normocephalic and atraumatic.  Eyes: Conjunctivae are normal.  Neck: Neck supple.  Cardiovascular: Normal rate, regular rhythm, intact distal pulses and normal pulses.  Murmur heard.  Systolic murmur is present with a grade of 3/6. Pulmonary/Chest: Effort normal and breath sounds normal. No respiratory distress. He has no decreased breath sounds.  Abdominal: Soft. There is no tenderness.  Musculoskeletal: Normal range of motion. He exhibits no edema.       Right lower leg: He exhibits no tenderness and no edema.       Left lower leg: He exhibits no tenderness and no edema.    Neurological: He is alert.  Skin: Skin is warm and dry.  Psychiatric: He has a normal mood and affect.  Nursing note and vitals reviewed.    ED Treatments / Results  Labs (all labs ordered are listed, but only abnormal results are displayed) Labs Reviewed  BASIC METABOLIC PANEL - Abnormal; Notable for the following components:      Result Value   Potassium 3.4 (*)    BUN <5 (*)    All other components within normal limits  CBC  I-STAT TROPONIN, ED    EKG EKG Interpretation  Date/Time:  Tuesday Jan 13 2018 21:25:44 EDT Ventricular Rate:  94 PR Interval:    QRS Duration: 82 QT Interval:  341 QTC Calculation: 427 R Axis:   92 Text Interpretation:  Sinus rhythm Borderline right axis deviation RSR' in V1 or V2, probably normal variant No previous tracing Confirmed by Gwyneth Sprout ([id number]) on 01/13/2018 9:46:50 PM   Radiology Dg Chest 2 View  Result Date: 01/13/2018 CLINICAL DATA:  Acute onset of mid chest pain and shortness of breath. EXAM: CHEST - 2 VIEW COMPARISON:  None. FINDINGS: The lungs are well-aerated and clear. There is no evidence of focal opacification, pleural effusion or pneumothorax. The heart is normal in size; the mediastinal contour is within normal limits. No acute osseous abnormalities are seen. IMPRESSION: No acute cardiopulmonary process seen. Electronically Signed   By: Roanna Raider M.D.   On: 01/13/2018 22:13    Procedures Procedures (including critical care time)  Medications Ordered in ED Medications  lactated ringers bolus 500 mL (has no administration in time range)  ondansetron (ZOFRAN) injection 4 mg (has no administration in time range)     Initial Impression / Assessment and Plan / ED Course  I have reviewed the triage vital signs and the nursing notes.  Pertinent labs & imaging results that were available during my care of the patient were reviewed by me and considered in my medical decision making (see chart for details).      Laboratory work-up largely within normal limits without concerning findings, awaiting second troponin.  EKG consistent with previous.  Chest x-ray shows no acute cardiopulmonary abnormality.  Initial troponin undetectable. Will repeat at 3hr, and reevaluate. Care of Pt handed off to oncoming team. Please see their documentation for further details.  Final Clinical Impressions(s) / ED Diagnoses   Final diagnoses:  Chest pain, unspecified type    ED Discharge Orders    None       Caren Griffins, MD 01/13/18 6045    Gwyneth Sprout, MD 01/15/18 2223

## 2018-01-13 NOTE — Discharge Instructions (Addendum)
1. Medications: usual home medications 2. Treatment: rest, drink plenty of fluids,  3. Follow Up: Please followup with your cardiologist in 2 days for discussion of your diagnoses and further evaluation after today's visit; if you do not have a primary care doctor use the resource guide provided to find one; Please return to the ER for return of chest pain or associated symptoms

## 2018-01-13 NOTE — ED Triage Notes (Signed)
Pt from home via [facility]. C/o SHOB, CP, nausea, and dizziness. Was outside playing when he started to notice s/s. Hx of cardiomyopathy & autism. Denies any pain @ this time.

## 2018-01-14 LAB — I-STAT TROPONIN, ED: Troponin i, poc: 0 ng/mL (ref 0.00–0.08)

## 2018-01-14 NOTE — Progress Notes (Signed)
Cardiology Moonlighter Note  Called by ED to discuss patient Ruben Mack, who is a 22 year old male with a history of LV noncompaction. He developed chest pain, nausea, vomiting today while exerting himself. His symptoms lasted for several hours and then resolved spontaneously. On arrival to the ED his vital signs were normal, his troponin was negative x 2, and his ECG revealed borderline LVH.   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 The patient does not have a personal history of hypertrophic cardiomyopathy, but rather has a family history of such. He has no personal history of coronary disease or ischemic heart disease. Given the patient's age and his prior workup for coronary disease which has been normal (normal coronaries by echo, normal stress perfusion CMR), I provided reassurance that these symptoms are extremely unlikely to be due to ischemic heart disease. Further workup to rule out other possible causes of chest pain is deferred to ED providers. Recommend patient follow up with Dr. Theodis Sato at [facility] for outpatient monitoring.  Rosario Jacks, MD Cardiology Fellow, PGY-5

## 2018-01-14 NOTE — ED Provider Notes (Signed)
Care assumed from Caren Griffins, MD.  Please see her full H&P.  In short,  Ruben Mack is a 22 y.o. male with a hx of family hx of HCM and personal hx of LV noncompaction followed by [facility] (Dr. Meredeth Ide) presents after chest pain on exertion.  Pt was running this afternoon when the chest pain began.  Pt subsequently developed nausea, diaphoresis and vomiting prior to arrival.    Physical Exam  BP 114/61   Pulse 80   Temp 98.7 F (37.1 C) (Oral)   Resp 15   Ht 5\' 1"  (1.549 m)   Wt 49.9 kg (110 lb)   SpO2 100%   BMI 20.78 kg/m   Physical Exam  Constitutional: He appears well-developed and well-nourished. No distress.  HENT:  Head: Normocephalic.  Eyes: Conjunctivae are normal. No scleral icterus.  Neck: Normal range of motion.  Cardiovascular: Normal rate and intact distal pulses.  Pulmonary/Chest: Effort normal.  Musculoskeletal: Normal range of motion.  Neurological: He is alert.  Skin: Skin is warm and dry.  Nursing note and vitals reviewed.   ED Course/Procedures   Clinical Course as of Jan 14 337  Wed Jan 14, 2018  0304 Repeat troponin is negative.  Patient reports complete resolution of his chest pain at this time.  Troponin i, poc: 0.00 [HM]  0327 Discussed with Dr. Santiago Glad who has reviewed the patient's current work-up today, his [facility] hx and we have discussed the HPI today.  [facility] imaged coronaries via [place] and have been normal.  He recommends d/c home with f/u with Dr. Mayo Ao.     [HM]  [phone] [facility] Records indicate: -Noncompaction cardiomyopathy; Borderline left ventricular hypertrophy, with more prominent apical hypertrophy with trabeculations.  - Stress cardiac MRI on 06/06/2010 showing normal LV function and volume. Normal to mild concentric left ventricular hypertrophy. No evidence of ischemia on stress perfusion imaging. No evidence of dynamic LV outflow tract obstruction. Normal delayed enhancement imaging.  - Normal 24 hour Holter  monitor, 2011  - Normal exercise treadmill test with no evidence of ischemia and normal blood pressure response to exercise, 2011 - Family history of hypertrophic cardiomyopathy and sudden cardiac death in multiple distant relatives, no first degree relatives.    [HM]    Clinical Course User Index [HM] Rumaysa Sabatino, Boyd Kerbs   Results for orders placed or performed during the hospital encounter of 01/13/18  Basic metabolic panel  Result Value Ref Range   Sodium 142 135 - 145 mmol/L   Potassium 3.4 (L) 3.5 - 5.1 mmol/L   Chloride 106 101 - 111 mmol/L   CO2 25 22 - 32 mmol/L   Glucose, Bld 97 65 - 99 mg/dL   BUN <5 (L) 6 - 20 mg/dL   Creatinine, Ser 9.60 0.61 - 1.24 mg/dL   Calcium 9.6 8.9 - 45.4 mg/dL   GFR calc non Af Amer >60 >60 mL/min   GFR calc Af Amer >60 >60 mL/min   Anion gap 11 5 - 15  CBC  Result Value Ref Range   WBC 9.2 4.0 - 10.5 K/uL   RBC 4.94 4.22 - 5.81 MIL/uL   Hemoglobin 14.6 13.0 - 17.0 g/dL   HCT 09.8 11.9 - 14.7 %   MCV 89.9 78.0 - 100.0 fL   MCH 29.6 26.0 - 34.0 pg   MCHC 32.9 30.0 - 36.0 g/dL   RDW 82.9 56.2 - 13.0 %   Platelets 342 150 - 400 K/uL  I-stat troponin, ED  Result Value Ref Range   Troponin i, poc 0.00 0.00 - 0.08 ng/mL   Comment 3          I-Stat Troponin, ED (not at [facility])  Result Value Ref Range   Troponin i, poc 0.00 0.00 - 0.08 ng/mL   Comment 3           Dg Chest 2 View  Result Date: 01/13/2018 CLINICAL DATA:  Acute onset of mid chest pain and shortness of breath. EXAM: CHEST - 2 VIEW COMPARISON:  None. FINDINGS: The lungs are well-aerated and clear. There is no evidence of focal opacification, pleural effusion or pneumothorax. The heart is normal in size; the mediastinal contour is within normal limits. No acute osseous abnormalities are seen. IMPRESSION: No acute cardiopulmonary process seen. Electronically Signed   By: Roanna Raider M.D.   On: 01/13/2018 22:13    EKG Interpretation  Date/Time:  Tuesday Jan 13 2018 21:25:44  EDT Ventricular Rate:  94 PR Interval:    QRS Duration: 82 QT Interval:  341 QTC Calculation: 427 R Axis:   92 Text Interpretation:  Sinus rhythm Borderline right axis deviation RSR' in V1 or V2, probably normal variant No previous tracing Confirmed by Gwyneth Sprout ([id number]) on 01/13/2018 9:46:50 PM       Procedures  MDM   Pt with a family hx of HCM, but a personal history of LV non-compaction presents today after exertional chest pain.  Patient initial and repeat troponin are negative.  Long discussion with cardiology reviewing work-up today, patient's history and events of today's episode.  He does not feel that today's episode is acute coronary in nature.  Chest x-ray is without evidence of free air, widened mediastinum, pneumothorax, pulmonary edema or pneumonia.  No reproducible chest pain on exam.  At this time, patient's chest pain has resolved completely.  He is without tachycardia or hypotension.  No hypoxia.  Less likely to be DVT.  Patient will be discharged home at this time with close follow-up with his pediatric cardiologist.   Chest pain, unspecified type     Juwuan Sedita, Boyd Kerbs 01/14/18 2426    Palumbo, April, MD 01/14/18 [phone]

## 2018-11-04 ENCOUNTER — Telehealth: Payer: Self-pay | Admitting: Family Medicine

## 2018-11-04 NOTE — Telephone Encounter (Signed)
Called grandmother who is current custodian with the patient with special needs and does not make his own medical decisions.  We discussed that she was only coming here on March 20 to establish care he did not have any urgent medical needs, patient is following with cardiology although he has no symptomatic complaints at the moment.  Using shared decision making we discussed with his grandmother the risk versus reward to bring him in with no urgent complaints and increasing risk of exposure here in the lobby.  She appreciated the option to go ahead and establish care with me over the phone right now and that we will have our first visit to get acquainted once everything has calmed down.  She is aware that she can call in for any urgent care or immediate needs and we will try to accommodate her whether over phone or by appointment is appropriate.  Dr. Parke Simmers

## 2018-11-06 ENCOUNTER — Ambulatory Visit: Payer: Medicaid Other | Admitting: Family Medicine

## 2018-11-20 ENCOUNTER — Emergency Department (HOSPITAL_COMMUNITY): Payer: Medicaid Other

## 2018-11-20 ENCOUNTER — Other Ambulatory Visit: Payer: Self-pay

## 2018-11-20 ENCOUNTER — Encounter (HOSPITAL_COMMUNITY): Payer: Self-pay | Admitting: Emergency Medicine

## 2018-11-20 ENCOUNTER — Emergency Department (HOSPITAL_COMMUNITY)
Admission: EM | Admit: 2018-11-20 | Discharge: 2018-11-20 | Disposition: A | Discharge status: Home / Self Care | Payer: Medicaid Other | Attending: Emergency Medicine | Admitting: Emergency Medicine

## 2018-11-20 DIAGNOSIS — R079 Chest pain, unspecified: Secondary | ICD-10-CM | POA: Diagnosis present

## 2018-11-20 DIAGNOSIS — Z7982 Long term (current) use of aspirin: Secondary | ICD-10-CM | POA: Insufficient documentation

## 2018-11-20 DIAGNOSIS — F909 Attention-deficit hyperactivity disorder, unspecified type: Secondary | ICD-10-CM | POA: Insufficient documentation

## 2018-11-20 LAB — CBC
HCT: 46 % (ref 39.0–52.0)
Hemoglobin: 14.8 g/dL (ref 13.0–17.0)
MCH: 29.5 pg (ref 26.0–34.0)
MCHC: 32.2 g/dL (ref 30.0–36.0)
MCV: 91.6 fL (ref 80.0–100.0)
Platelets: 284 10*3/uL (ref 150–400)
RBC: 5.02 MIL/uL (ref 4.22–5.81)
RDW: 14.2 % (ref 11.5–15.5)
WBC: 8.2 10*3/uL (ref 4.0–10.5)
nRBC: 0 % (ref 0.0–0.2)

## 2018-11-20 LAB — BASIC METABOLIC PANEL
Anion gap: 13 (ref 5–15)
BUN: 6 mg/dL (ref 6–20)
CO2: 22 mmol/L (ref 22–32)
Calcium: 9.7 mg/dL (ref 8.9–10.3)
Chloride: 105 mmol/L (ref 98–111)
Creatinine, Ser: 0.78 mg/dL (ref 0.61–1.24)
GFR calc Af Amer: >60 mL/min (ref >60)
GFR calc non Af Amer: >60 mL/min (ref >60)
Glucose, Bld: 106 mg/dL — ABNORMAL HIGH (ref 70–99)
Potassium: 3.9 mmol/L (ref 3.5–5.1)
Sodium: 140 mmol/L (ref 135–145)

## 2018-11-20 LAB — TROPONIN I
Troponin I: <0.03 ng/mL (ref <0.03)
Troponin I: <0.03 ng/mL (ref <0.03)

## 2018-11-20 MED ORDER — ONDANSETRON HCL 4 MG PO TABS
4.0000 mg | ORAL_TABLET | Freq: Once | ORAL | Status: AC
  Administered 2018-11-20: 4 mg via ORAL
  Filled 2018-11-20: qty 1

## 2018-11-20 MED ORDER — SODIUM CHLORIDE 0.9% FLUSH: 3.0000 mL | Freq: Once | INTRAVENOUS | Status: DC

## 2018-11-20 MED ORDER — OXYCODONE-ACETAMINOPHEN 5-325 MG PO TABS
1.0000 | ORAL_TABLET | Freq: Once | ORAL | Status: AC
  Administered 2018-11-20: 1 via ORAL
  Filled 2018-11-20: qty 1

## 2018-11-20 NOTE — ED Provider Notes (Signed)
[facility] EMERGENCY DEPARTMENT Provider Note   CSN: [id number] Arrival date & time: 11/20/18  1810    History   Chief Complaint Chief Complaint  Patient presents with  . Chest Pain    HPI Ruben Mack is a 23 y.o. male with a history of hypertrophic cardiomyopathy and developmental delay.  Patient has a family history of HCM.  Patient is followed by [facility] cardiology Dr. Meredeth Ide.  Patient presents today with chest pain.  He states he was sitting in the chair when it started.  Earlier today he had gone and got groceries however was feeling like his normal self.  Patient describes the pain is located in the center of his chest and it feels like pressure.  The pain is intermittent.  Rates it 5 out of 10 in severity.  It does not radiate.  Patient also has a tooth that is bothering him which he took ibuprofen for around the time the chest pain started.  Patient states ibuprofen did not improve his chest pain.  Does report associated nausea.  Also discussed with patient's guardian his grandmother Ruben Mack.  She states he has had an episode of chest pain like this in the past about 6 months ago that resolved spontaneously.  She states he has an appointment scheduled with his cardiologist in May.  Also told me that he is worn a Holter monitor approximately 1 year ago which did not show any significant events.  Patient denies any fever, chills, palpitations, shortness of breath, abdominal pain, cough, diaphoresis.          Past Medical History:  Diagnosis Date  . 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 Cristy Folks ([facility])  . Mental disability    intellectual  . Shortness of breath dyspnea    uses inhaler    Patient Active Problem List   Diagnosis Date Noted  . Short stature 01/26/2013  . 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 MOUTH SURGERY     palate adjustment  . TOOTH EXTRACTION N/A 03/03/2015   Procedure: WISDOM EXTRACTIONS;  Surgeon: Ocie Doyne, DDS;  Location: [facility] OR;  Service: Oral Surgery;  Laterality: N/A;        Home Medications    Prior to Admission medications   Medication Sig Start Date End Date Taking? Authorizing Provider  aspirin 81 MG chewable tablet Chew 324 mg by mouth as needed (for sudden onset of chest pain).   Yes [provider]  oxyCODONE-acetaminophen (PERCOCET) 5-325 MG per tablet Take 1-2 tablets by mouth every 4 (four) hours as needed. Patient not taking: Reported on 11/20/2018 03/03/15   Ocie Doyne, DDS    Family History Family History  Problem Relation Age of Onset  . Cancer Other     Social History Social History   Tobacco Use  . Smoking status: Never Smoker  . Smokeless tobacco: Never Used  Substance Use Topics  . Alcohol use: No  . Drug use: No     Allergies   Poison ivy extract   Review of Systems Review of Systems  Constitutional: Negative for chills and fever.  HENT: Negative for congestion, rhinorrhea, sinus pressure and sore throat.   Eyes: Negative for pain and  redness.  Respiratory: Negative for cough, shortness of breath and wheezing.   Cardiovascular: Positive for chest pain. Negative for palpitations.  Gastrointestinal: Positive for nausea. Negative for abdominal pain, constipation, diarrhea and vomiting.  Genitourinary: Negative for dysuria.  Musculoskeletal: Negative for arthralgias, back pain, myalgias and neck pain.  Skin: Negative for rash and wound.  Neurological: Negative for dizziness, syncope, weakness, numbness and headaches.  Psychiatric/Behavioral: Negative for confusion.     Physical Exam Updated Vital Signs BP  124/82   Pulse 83   Temp 97.9 F (36.6 C) (Oral)   Resp 16   SpO2 99%   Physical Exam Vitals signs and nursing note reviewed.  Constitutional:      Appearance: He is well-developed. He is not toxic-appearing.  HENT:     Head: Normocephalic and atraumatic.     Mouth/Throat:     Mouth: Mucous membranes are moist.     Pharynx: Oropharynx is clear.  Eyes:     General: No scleral icterus.       Right eye: No discharge.        Left eye: No discharge.     Extraocular Movements: Extraocular movements intact.     Conjunctiva/sclera: Conjunctivae normal.     Pupils: Pupils are equal, round, and reactive to light.  Neck:     Musculoskeletal: Normal range of motion.  Cardiovascular:     Rate and Rhythm: Normal rate and regular rhythm.     Pulses: Normal pulses.          Radial pulses are 2+ on the right side and 2+ on the left side.     Heart sounds: Normal heart sounds.  Pulmonary:     Effort: Pulmonary effort is normal. No respiratory distress.     Breath sounds: Normal breath sounds. No wheezing or rales.  Abdominal:     General: There is no distension.     Palpations: Abdomen is soft.     Tenderness: There is no abdominal tenderness. There is no guarding or rebound.  Musculoskeletal: Normal range of motion.     Right lower leg: No edema.     Left lower leg: No edema.     Comments: Negative homans sign bilaterally  Skin:    General: Skin is warm and dry.  Neurological:     Mental Status: He is oriented to person, place, and time.     Comments: Fluent speech, no facial droop.  Psychiatric:        Behavior: Behavior normal.      ED Treatments / Results  Labs (all labs ordered are listed, but only abnormal results are displayed) Labs Reviewed  BASIC METABOLIC PANEL - Abnormal; Notable for the following components:      Result Value   Glucose, Bld 106 (*)    All other components within normal limits  CBC  TROPONIN I  TROPONIN I    EKG EKG Interpretation   Date/Time:  Friday November 20 2018 20:00:34 EDT Ventricular Rate:  81 PR Interval:  130 QRS Duration: 81 QT Interval:  360 QTC Calculation: 418 R Axis:   102 Text Interpretation:  Sinus rhythm Consider right atrial enlargement Borderline right axis deviation Minimal ST depression, inferior leads No significant change since last tracing Confirmed by Melene Plan [phone]) on 11/20/2018 10:05:33 PM   Radiology Dg Chest 2 View  Result Date: 11/20/2018 CLINICAL DATA:  23 year old male with chest pain. EXAM: CHEST - 2 VIEW COMPARISON:  Chest radiograph dated 01/13/2018 FINDINGS: The lungs are  clear. There is no pleural effusion pneumothorax. The cardiac silhouette is within normal limits. No acute osseous pathology. 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 IMPRESSION: No active cardiopulmonary disease. Electronically Signed   By: Elgie Collard M.D.   On: 11/20/2018 18:45    Procedures Procedures (including critical care time)  Medications Ordered in ED Medications  oxyCODONE-acetaminophen (PERCOCET/ROXICET) 5-325 MG per tablet 1 tablet (1 tablet Oral Given 11/20/18 2152)  ondansetron (ZOFRAN) tablet 4 mg (4 mg Oral Given 11/20/18 2152)     Initial Impression / Assessment and Plan / ED Course  I have reviewed the triage vital signs and the nursing notes.  Pertinent labs & imaging results that were available during my care of the patient were reviewed by me and considered in my medical decision making (see chart for details).   Patient presents to the emergency department with chest pain. Patient nontoxic appearing, in no apparent distress. Pt had elevated blood pressure in triage, but when repeated was much improved. Given pt's cardiac history I reviewed his records from previous visits as well as cardiology notes.  Due to visitor restrictions because of coronavirus, pt gave permission for me to call his guardian for more history because he is having a hard time remembering. Discussed at length with  his guardian this chest pain and the family history of HCM. She states pt wore holter monitor within the last year that showed no events. He is scheduled to see his cardiologist in May for routine appointment. I informed her we will do a cardiac work up today and if all is negative recommend close follow up cardiologist.  Fairly benign physical exam. DDX: ACS, pulmonary embolism, dissection, pneumothorax, effusion, infiltrate, arrhythmia, anemia, electrolyte derangement, MSK. Evaluation initiated with labs, EKG, and CXR. Patient on cardiac monitor. On reevaluation pt states his chest pain has resolved.   Work-up in the ER unremarkable. Labs reviewed, no leukocytosis, anemia, or significant electrolyte abnormality. CXR without infiltrate, effusion, pneumothorax, or fracture/dislocation.   EKG without obvious ischemia, delta troponin negative, doubt ACS. Patient is low risk wells, PERC negative, doubt pulmonary embolism. Pain is not a tearing sensation, symmetric pulses, no widening of mediastinum on CXR, doubt dissection. Cardiac monitor reviewed, no notable arrhythmias or tachycardia. Patient has appeared hemodynamically stable throughout ER visit and appears safe for discharge, again with close cardiology follow up. I discussed results, treatment plan, need for cardiology follow-up, and return precautions with the patient. I was unable to reach his guardian on the phone, gave detailed written discharge instructions about importance of close cardiology follow up. Provided opportunity for questions, patient confirmed understanding and is in agreement with plan.    Pt case discussed with Dr. Adela Lank who agrees with my plan.   This note was prepared with assistance of Conservation officer, historic buildings. Occasional wrong-word or sound-a-like substitutions may have occurred due to the inherent limitations of voice recognition software.    Final Clinical Impressions(s) / ED Diagnoses   Final diagnoses:   Chest pain, unspecified type    ED Discharge Orders    None       Sherene Sires, PA-C 11/22/18 1151    Melene Plan, DO 11/22/18 1514

## 2018-11-20 NOTE — Discharge Instructions (Addendum)
Read instructions below for reasons to return to the Emergency Department.   It is recommended that your follow up with your cardiologist regards to today's visit.  He is call the cardiologist on Monday to schedule a follow-up appointment.  They may not be able to see you in office but hopefully will do a tele-visit if needed.  You can take Tylenol or ibuprofen for pain if needed.  Please take as directed on the bottle.  Tests performed today include: An EKG of your heart A chest x-ray Cardiac enzymes - a blood test for heart muscle damage Blood counts and electrolytes Your work-up today is negative.  Your lab work, chest x-ray and EKG are reassuring.    chest Pain (Nonspecific)  HOME CARE INSTRUCTIONS  -For the next few days, avoid physical activities that bring on chest pain. Continue physical activities as directed.  --Follow your caregiver's suggestions for further testing if your chest pain does not go away.  -Keep any follow-up appointments you made. If you do not go to an appointment, you could develop lasting (chronic) problems with pain. If there is any problem keeping an appointment, you must call to reschedule.   SEEK MEDICAL CARE IF:  You think you are having problems from the medicine you are taking. Read your medicine instructions carefully.  Your chest pain does not go away, even after treatment.  You develop a rash with blisters on your chest.   SEEK IMMEDIATE MEDICAL CARE IF:  You have increased chest pain or pain that spreads to your arm, neck, jaw, back, or belly (abdomen).  You develop shortness of breath, an increasing cough, or you are coughing up blood.  You have severe back or abdominal pain, feel sick to your stomach (nauseous) or throw up (vomit).  You develop severe weakness, fainting, or chills.  You have an oral temperature above 102 F (38.9 C), not controlled by medicine.   THIS IS AN EMERGENCY. Do not wait to see if the pain will go away. Get medical  help at once. Call 911. Do not drive yourself to the hospital.

## 2018-11-20 NOTE — ED Triage Notes (Signed)
Pt arrives with c/o of CP that started 1 hour ago pt state he started suddenly and he feels like its hard to breath. Pt states he has "a lot of cardiac history and has surgeries  in the past due to his heart".

## 2018-11-20 NOTE — ED Notes (Signed)
Discharge instructions and follow up information discussed with pt and family member (patty, grandmother) via pt's phone. Family and pt. State understanding and no questions at this time.

## 2018-12-04 ENCOUNTER — Telehealth: Payer: Self-pay | Admitting: *Deleted

## 2018-12-04 NOTE — Telephone Encounter (Signed)
REFERRAL SENT TO SCHEDULING AND NOTES ON FILE FROM [facility] [phone] DR. Johny Blamer

## 2018-12-18 ENCOUNTER — Telehealth: Payer: Self-pay | Admitting: Family Medicine

## 2018-12-18 DIAGNOSIS — Z0000 Encounter for general adult medical examination without abnormal findings: Secondary | ICD-10-CM

## 2018-12-18 NOTE — Telephone Encounter (Signed)
Scheduled for 5/5

## 2018-12-18 NOTE — Telephone Encounter (Signed)
Grandmother who is the legal guardian called and would like Dr. Parke Simmers to put in orders for labs. She said that Ruben Mack and herself had a virtual visit with Dr. Parke Simmers. She is trying to get him in a group home and they need the labs done before they will except him. The labs that he needs are   Comprehensive Panel  CBC  Hep B  HIV   Please call her when this is done so that she can make an appointment for him to come in. [place]

## 2018-12-22 ENCOUNTER — Other Ambulatory Visit: Payer: Self-pay

## 2018-12-22 ENCOUNTER — Other Ambulatory Visit: Payer: Medicaid Other

## 2018-12-22 DIAGNOSIS — Z0000 Encounter for general adult medical examination without abnormal findings: Secondary | ICD-10-CM

## 2018-12-23 ENCOUNTER — Telehealth: Payer: Self-pay | Admitting: *Deleted

## 2018-12-23 LAB — CMP AND LIVER
ALT: 16 IU/L (ref 0–44)
AST: 18 IU/L (ref 0–40)
Albumin: 5.1 g/dL (ref 4.1–5.2)
Alkaline Phosphatase: 70 IU/L (ref 39–117)
BUN: 5 mg/dL — ABNORMAL LOW (ref 6–20)
Bilirubin Total: 1.6 mg/dL — ABNORMAL HIGH (ref 0.0–1.2)
Bilirubin, Direct: 0.3 mg/dL (ref 0.00–0.40)
CO2: 23 mmol/L (ref 20–29)
Calcium: 10.2 mg/dL (ref 8.7–10.2)
Chloride: 102 mmol/L (ref 96–106)
Creatinine, Ser: 0.74 mg/dL — ABNORMAL LOW (ref 0.76–1.27)
GFR calc Af Amer: 151 mL/min/{1.73_m2} (ref >59)
GFR calc non Af Amer: 131 mL/min/{1.73_m2} (ref >59)
Glucose: 92 mg/dL (ref 65–99)
Potassium: 4.2 mmol/L (ref 3.5–5.2)
Sodium: 140 mmol/L (ref 134–144)
Total Protein: 7.4 g/dL (ref 6.0–8.5)

## 2018-12-23 LAB — HIV ANTIBODY (ROUTINE TESTING W REFLEX): HIV Screen 4th Generation wRfx: NONREACTIVE

## 2018-12-23 LAB — CBC
Hematocrit: 44.3 % (ref 37.5–51.0)
Hemoglobin: 15.3 g/dL (ref 13.0–17.7)
MCH: 30.4 pg (ref 26.6–33.0)
MCHC: 34.5 g/dL (ref 31.5–35.7)
MCV: 88 fL (ref 79–97)
Platelets: 297 10*3/uL (ref 150–450)
RBC: 5.03 x10E6/uL (ref 4.14–5.80)
RDW: 13 % (ref 11.6–15.4)
WBC: 8.9 10*3/uL (ref 3.4–10.8)

## 2018-12-23 LAB — HEPATITIS B SURFACE ANTIGEN: Hepatitis B Surface Ag: NEGATIVE

## 2018-12-23 LAB — HEPATITIS B CORE ANTIBODY, TOTAL: Hep B Core Total Ab: NEGATIVE

## 2018-12-23 NOTE — Telephone Encounter (Signed)
LVM on home phone to call office and then reached pt on cell phone and gave him the below information. Brantley Naser Zimmerman Rumple, CMA

## 2018-12-23 NOTE — Telephone Encounter (Signed)
-----   Message from Ellwood Dense, DO sent at 12/23/2018  9:17 AM EDT ----- Please let patient know of normal results. Thanks!

## 2018-12-25 NOTE — Telephone Encounter (Signed)
Faxed labs. Attempted to let grandmother know, no answer, LVM.

## 2018-12-25 NOTE — Telephone Encounter (Signed)
Grandmother called nurse line requesting we fax results to patients group home. However, grandmother does not have fax number on hand. Grandmother informed to retrieve this information and call back. I have printed the labs. Will await fax number.

## 2018-12-25 NOTE — Telephone Encounter (Signed)
Grandmother called back.  Attn: Deloris Monroe @ [facility] help [phone]  She would like a call when we fax.  Jone Baseman, CMA

## 2019-01-04 ENCOUNTER — Other Ambulatory Visit: Payer: Self-pay

## 2019-01-04 ENCOUNTER — Emergency Department (HOSPITAL_COMMUNITY): Payer: Medicaid Other

## 2019-01-04 ENCOUNTER — Emergency Department (HOSPITAL_COMMUNITY)
Admission: EM | Admit: 2019-01-04 | Discharge: 2019-01-04 | Disposition: A | Discharge status: Home / Self Care | Payer: Medicaid Other | Attending: Emergency Medicine | Admitting: Emergency Medicine

## 2019-01-04 DIAGNOSIS — R0789 Other chest pain: Secondary | ICD-10-CM | POA: Diagnosis not present

## 2019-01-04 DIAGNOSIS — F99 Mental disorder, not otherwise specified: Secondary | ICD-10-CM | POA: Insufficient documentation

## 2019-01-04 DIAGNOSIS — R079 Chest pain, unspecified: Secondary | ICD-10-CM | POA: Diagnosis present

## 2019-01-04 LAB — CBC
HCT: 43.2 % (ref 39.0–52.0)
Hemoglobin: 14.2 g/dL (ref 13.0–17.0)
MCH: 29.9 pg (ref 26.0–34.0)
MCHC: 32.9 g/dL (ref 30.0–36.0)
MCV: 90.9 fL (ref 80.0–100.0)
Platelets: 309 10*3/uL (ref 150–400)
RBC: 4.75 MIL/uL (ref 4.22–5.81)
RDW: 14.3 % (ref 11.5–15.5)
WBC: 13.9 10*3/uL — ABNORMAL HIGH (ref 4.0–10.5)
nRBC: 0 % (ref 0.0–0.2)

## 2019-01-04 LAB — TROPONIN I: Troponin I: <0.03 ng/mL (ref <0.03)

## 2019-01-04 LAB — BASIC METABOLIC PANEL
Anion gap: 12 (ref 5–15)
BUN: <5 mg/dL — ABNORMAL LOW (ref 6–20)
CO2: 23 mmol/L (ref 22–32)
Calcium: 9.4 mg/dL (ref 8.9–10.3)
Chloride: 105 mmol/L (ref 98–111)
Creatinine, Ser: 0.77 mg/dL (ref 0.61–1.24)
GFR calc Af Amer: >60 mL/min (ref >60)
GFR calc non Af Amer: >60 mL/min (ref >60)
Glucose, Bld: 111 mg/dL — ABNORMAL HIGH (ref 70–99)
Potassium: 3.7 mmol/L (ref 3.5–5.1)
Sodium: 140 mmol/L (ref 135–145)

## 2019-01-04 MED ORDER — ACETAMINOPHEN 500 MG PO TABS
1000.0000 mg | ORAL_TABLET | Freq: Once | ORAL | Status: AC
  Administered 2019-01-04: 1000 mg via ORAL
  Filled 2019-01-04: qty 2

## 2019-01-04 MED ORDER — SODIUM CHLORIDE 0.9% FLUSH: 3.0000 mL | Freq: Once | INTRAVENOUS | Status: DC

## 2019-01-04 MED ORDER — ONDANSETRON 4 MG PO TBDP
4.0000 mg | ORAL_TABLET | Freq: Once | ORAL | Status: AC
  Administered 2019-01-04: 4 mg via ORAL
  Filled 2019-01-04: qty 1

## 2019-01-04 NOTE — ED Notes (Signed)
Patient transported to X-ray 

## 2019-01-04 NOTE — ED Provider Notes (Signed)
[facility] EMERGENCY DEPARTMENT Provider Note   CSN: [id number] Arrival date & time: 01/04/19  4098    History   Chief Complaint Chief Complaint  Patient presents with  . Chest Pain    HPI Ruben Mack is a 23 y.o. male.  HPI: A 23 year old patient presents for evaluation of chest pain. Initial onset of pain was less than one hour ago. The patient's chest pain is well-localized, is sharp and is not worse with exertion. The patient complains of nausea. The patient's chest pain is not middle- or left-sided, is not described as heaviness/pressure/tightness and does not radiate to the arms/jaw/neck. The patient denies diaphoresis. The patient has no history of stroke, has no history of peripheral artery disease, has not smoked in the past 90 days, denies any history of treated diabetes, has no relevant family history of coronary artery disease (first degree relative at less than age 46), is not hypertensive, has no history of hypercholesterolemia and does not have an elevated BMI (>=30).   23 y/o male with hx of developmental delay and HCM presents to the ED for c/o chest pain.  Chest pain is sharp and nonradiating, located just to the right of his sternum.  It began tonight while he was walking [place] [address] with his uncle.  Unable to articulate why he was walking down the highway at night, but suggests it was due to a flat tire on a car at his uncle's house. States pain is worse when he takes a deep breath. He has felt mildly lightheaded with his pain, but he also gets lightheaded from time-to-time when chest pain is absent. C/o associated nausea as well. No vomiting, SOB, fevers, leg swelling. Family history of hypertrophic cardiomyopathy and sudden cardiac death in multiple distant relatives, no first degree relatives.      Past Medical History:  Diagnosis Date  . 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 Cristy Folks ([facility])  . Mental disability    intellectual  . Shortness of breath dyspnea    uses inhaler    Patient Active Problem List   Diagnosis Date Noted  . Short stature 01/26/2013  . 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 MOUTH SURGERY     palate adjustment  . TOOTH EXTRACTION N/A 03/03/2015   Procedure: WISDOM EXTRACTIONS;  Surgeon: Ocie Doyne, DDS;  Location: [facility] OR;  Service: Oral Surgery;  Laterality: N/A;        Home Medications    Prior to Admission medications   Medication Sig Start Date End Date Taking? Authorizing Provider  oxyCODONE-acetaminophen (PERCOCET) 5-325 MG per tablet Take 1-2 tablets by mouth every 4 (four) hours as needed. Patient not taking: Reported on 11/20/2018 03/03/15   Ocie Doyne, DDS    Family History Family History  Problem Relation Age of Onset  . Cancer Other     Social History Social History   Tobacco Use  . Smoking status: Never Smoker  . Smokeless tobacco: Never Used  Substance Use Topics  . Alcohol use: No  . Drug use: No     Allergies   Poison ivy extract   Review of Systems Review of Systems Ten systems reviewed and are negative for acute change,  except as noted in the HPI.    Physical Exam Updated Vital Signs BP 122/87   Pulse 86   Resp (!) 21   SpO2 100%   Physical Exam Vitals signs and nursing note reviewed.  Constitutional:      General: He is not in acute distress.    Appearance: He is well-developed. He is not diaphoretic.     Comments: Small stature.  HENT:     Head: Normocephalic and atraumatic.  Eyes:     General: No scleral icterus.    Conjunctiva/sclera: Conjunctivae normal.  Neck:     Musculoskeletal: Normal range of  motion.  Cardiovascular:     Rate and Rhythm: Normal rate and regular rhythm.     Pulses: Normal pulses.  Pulmonary:     Effort: Pulmonary effort is normal. No respiratory distress.     Comments: Respirations even and unlabored Chest:     Chest wall: Tenderness (TTP to right chest wall; no crepitus) present.    Musculoskeletal: Normal range of motion.     Comments: No BLE edema  Skin:    General: Skin is warm and dry.     Coloration: Skin is not pale.     Findings: No erythema or rash.  Neurological:     General: No focal deficit present.     Mental Status: He is alert and oriented to person, place, and time.     Coordination: Coordination normal.  Psychiatric:        Behavior: Behavior normal.      ED Treatments / Results  Labs (all labs ordered are listed, but only abnormal results are displayed) Labs Reviewed  BASIC METABOLIC PANEL - Abnormal; Notable for the following components:      Result Value   Glucose, Bld 111 (*)    BUN <5 (*)    All other components within normal limits  CBC - Abnormal; Notable for the following components:   WBC 13.9 (*)    All other components within normal limits  TROPONIN I    EKG EKG Interpretation  Date/Time:  Monday Jan 04 2019 03:42:28 EDT Ventricular Rate:  81 PR Interval:    QRS Duration: 78 QT Interval:  350 QTC Calculation: 407 R Axis:   98 Text Interpretation:  Sinus arrhythmia Borderline right axis deviation When compared with ECG of 11/20/2018, No significant change was found Confirmed by Dione Booze ([id number]) on 01/04/2019 5:04:20 AM   Radiology Dg Chest 2 View  Result Date: 01/04/2019 CLINICAL DATA:  Chest pain EXAM: CHEST - 2 VIEW COMPARISON:  11/20/2018 FINDINGS: Normal heart size and mediastinal contours. No acute infiltrate or edema. No effusion or pneumothorax. No acute osseous findings. IMPRESSION: Negative chest. Electronically Signed   By: Marnee Spring M.D.   On: 01/04/2019 04:13    Procedures  Procedures (including critical care time)  Medications Ordered in ED Medications  sodium chloride flush (NS) 0.9 % injection 3 mL (0 mLs Intravenous Hold 01/04/19 0344)  ondansetron (ZOFRAN-ODT) disintegrating tablet 4 mg (4 mg Oral Given 01/04/19 0410)  acetaminophen (TYLENOL) tablet 1,000 mg (1,000 mg Oral Given 01/04/19 0410)     Initial Impression / Assessment and Plan / ED Course  I have reviewed the triage vital signs and the nursing notes.  Pertinent labs & imaging results that were available during my care of the patient were reviewed by me and considered in my medical decision making (see chart for details).     HEAR Score: 1  3:50 AM  Cardiac history from [facility] Cardiology reviewed. As of 12/02/18 history as follows: 1. Noncompaction cardiomyopathy; Borderline left ventricular hypertrophy, with more prominent apical hypertrophy with trabeculations. Unchanged echo on 12/02/18 compared to prior. 2. Stress cardiac MRI on 06/06/2010 showing normal LV function and volume. Normal to mild concentric left ventricular hypertrophy. No evidence of ischemia on stress perfusion imaging. No evidence of dynamic LV outflow tract obstruction. Normal delayed enhancement imaging.  3. Normal 24 hour Holter monitor, 2011 and 2018  4. Normal exercise treadmill test with no evidence of ischemia and normal blood pressure response to exercise, 2011 5. History of exertional chest pain, shortness of breath, possibly anxiety vs. exercise induced bronchospasm vs musculoskeletal.   28:26 AM 23 year old male with a personal and family history of HCM presents to the emergency department for evaluation of chest pain which began when he was walking [place] [address] tonight.  No associated fevers or syncope.  Vital signs stable on arrival.  Pain reported to be to the right of his sternum.  It is reproducible on palpation suggesting musculoskeletal etiology.  Given history, will obtain labs and chest x-ray.  EKG completed on  arrival.  5:16 AM Nonspecific leukocytosis noted. Otherwise labs reassuring. Troponin negative.  Chest x-ray without evidence of pneumonia, pneumothorax, pleural effusion.  Heart size appears normal.  No mediastinal widening.  Symptoms atypical for cardiac etiology. Plan for discharge and outpatient cardiology f/u PRN.   Final Clinical Impressions(s) / ED Diagnoses   Final diagnoses:  Atypical chest pain    ED Discharge Orders    None       Antony Madura, PA-C 01/04/19 0523    Dione Booze, MD 01/04/19 [phone]

## 2019-01-04 NOTE — ED Triage Notes (Signed)
Per pt he was walking [place] [address] and started having chest pain. Said he walked a long ways. No cough, no SOB, NO FEVERS. pT says it is in the center of chest. Does not radiate

## 2019-01-04 NOTE — Discharge Instructions (Signed)
Your work-up in the emergency department today was reassuring.  We recommend follow-up with your cardiologist, if needed.  You may benefit from repeat Holter monitor which you last had completed in 2018 and/or stress test which was last done in 2011.  Return to the ED for new or concerning symptoms.

## 2019-01-18 ENCOUNTER — Ambulatory Visit: Payer: Medicaid Other

## 2019-01-19 ENCOUNTER — Ambulatory Visit (INDEPENDENT_AMBULATORY_CARE_PROVIDER_SITE_OTHER): Payer: Medicaid Other | Admitting: Family Medicine

## 2019-01-19 ENCOUNTER — Other Ambulatory Visit: Payer: Self-pay

## 2019-01-19 ENCOUNTER — Encounter: Payer: Self-pay | Admitting: Family Medicine

## 2019-01-19 VITALS — BP 100/60 | HR 73

## 2019-01-19 DIAGNOSIS — Z111 Encounter for screening for respiratory tuberculosis: Secondary | ICD-10-CM | POA: Diagnosis not present

## 2019-01-19 DIAGNOSIS — Z09 Encounter for follow-up examination after completed treatment for conditions other than malignant neoplasm: Secondary | ICD-10-CM

## 2019-01-19 NOTE — Progress Notes (Signed)
   Subjective:    Patient ID: Ruben Mack, male    DOB: [birth year], 23 y.o.   MRN: [id number]   CC: Hospital follow up   HPI: Patient is a 23 yo male with a past medical history significant for HCM who present today for recent ED follow up for chest pain. Patient reports since last office visit  He has been feeling well, no chest or palpitations. He continue to manage himself and know not to overexert himself. He is denies any SOB, dizziness, nausea, vision changes. Patient currently lives in a group home.   Smoking status reviewed   ROS: all other systems were reviewed and are negative other than in the HPI   Past Medical History:  Diagnosis Date  . 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 Cristy Folks ([facility])  . Mental disability    intellectual  . Shortness of breath dyspnea    uses inhaler    Past Surgical History:  Procedure Laterality Date  . MOUTH SURGERY     palate adjustment  . TOOTH EXTRACTION N/A 03/03/2015   Procedure: WISDOM EXTRACTIONS;  Surgeon: Ocie Doyne, DDS;  [facility];  Service: Oral Surgery;  Laterality: N/A;    Past medical history, surgical, family, and social history reviewed and updated in the EMR as appropriate.  Objective:  BP 100/60   Pulse 73   SpO2 99%   Vitals and nursing note reviewed  General: NAD, pleasant, able to answer basic question Cardiac: RRR, normal heart sounds, no murmurs. 2+ radial and PT pulses bilaterally Respiratory: CTAB, normal effort, No wheezes, rales or rhonchi Abdomen: soft, nontender, nondistended, no hepatic or splenomegaly, +BS Extremities: no edema or cyanosis. WWP. Skin: warm and dry, no rashes noted Neuro: alert and oriented x4, no focal deficits Psych: Normal affect and mood   Assessment & Plan:   Hospital discharge follow-up Patient with history of HCM seen in the ED for chest pain. Work up then was  reassuring. Since discharge patient back to baseline with no new reports of chest pain. Discuss monitoring symptoms and returning for further evaluation with PCP and cardiology referral if continue to experience frequent chest pain.   PPD placed today, required since patient is living in a group home.   Lovena Neighbours, MD [facility] PGY-3

## 2019-01-19 NOTE — Assessment & Plan Note (Signed)
Patient with history of HCM seen in the ED for chest pain. Work up then was reassuring. Since discharge patient back to baseline with no new reports of chest pain. Discuss monitoring symptoms and returning for further evaluation with PCP and cardiology referral if continue to experience frequent chest pain.

## 2019-01-21 ENCOUNTER — Ambulatory Visit: Payer: Medicaid Other

## 2019-01-21 ENCOUNTER — Other Ambulatory Visit: Payer: Self-pay

## 2019-01-21 DIAGNOSIS — Z111 Encounter for screening for respiratory tuberculosis: Secondary | ICD-10-CM

## 2019-01-21 LAB — TB SKIN TEST
Induration: 0 mm
TB Skin Test: NEGATIVE

## 2019-01-21 NOTE — Progress Notes (Signed)
PPD Reading Note  PPD read and results entered in EpicCare.  Result: 0 mm induration.  Interpretation: Negative

## 2019-01-21 NOTE — Addendum Note (Signed)
Addended by: Aquilla Solian on: 01/21/2019 04:04 PM   Modules accepted: Orders

## 2019-01-27 ENCOUNTER — Other Ambulatory Visit: Payer: Self-pay

## 2019-01-27 ENCOUNTER — Ambulatory Visit (INDEPENDENT_AMBULATORY_CARE_PROVIDER_SITE_OTHER): Payer: Medicaid Other | Admitting: Student in an Organized Health Care Education/Training Program

## 2019-01-27 ENCOUNTER — Encounter: Payer: Self-pay | Admitting: Student in an Organized Health Care Education/Training Program

## 2019-01-27 VITALS — BP 104/62 | HR 77 | Temp 98.3°F

## 2019-01-27 DIAGNOSIS — I517 Cardiomegaly: Secondary | ICD-10-CM

## 2019-01-27 DIAGNOSIS — Z23 Encounter for immunization: Secondary | ICD-10-CM | POA: Diagnosis not present

## 2019-01-27 DIAGNOSIS — Z0000 Encounter for general adult medical examination without abnormal findings: Secondary | ICD-10-CM

## 2019-01-27 HISTORY — DX: Cardiomegaly: I51.7

## 2019-01-27 HISTORY — DX: Encounter for general adult medical examination without abnormal findings: Z00.00

## 2019-01-27 NOTE — Patient Instructions (Signed)
It was a pleasure seeing you today in our clinic.   Our clinic's number is [phone]. Please call with questions or concerns about what we discussed today.  Be well, Dr. Kerri-Anne Haeberle   

## 2019-01-27 NOTE — Progress Notes (Signed)
   CC: Physical for group home  HPI: Ruben Mack is a 23 y.o. male with PMH significant for congenital microcephaly, ADHD, left ventricular hypertrophy who presents to [facility] today for physical to enter a group home.  The patient's mother is present during the visit and available to provide history.  Patient is currently asymptomatic. He denies blurry vision, chest pain, ear pain, dyspnea, rhinorrhea or congestion. No N/V/D/C. He does have a hx of LVH/cardiomyopathy for which he follows with [facility]. Based on notes in care everywhere he has no activity restrictions and will need to follow up with an adult cardiologist in 2 years.  Patient has overdue vaccinations which we will address at today's visit.  Review of Symptoms:  See HPI for ROS.   CC, SH/smoking status, and VS noted.  Objective: BP 104/62   Pulse 77   Temp 98.3 F (36.8 C) (Oral)   SpO2 99%  GEN: NAD, alert, cooperative, and pleasant. EYE: no conjunctival injection, pupils equally round and reactive to light ENMT: normal tympanic light reflex, no nasal polyps,no rhinorrhea, no pharyngeal erythema or exudates NECK: full ROM, no thyromegaly RESPIRATORY: clear to auscultation bilaterally with no wheezes, rhonchi or rales, good effort CV: RRR, no m/r/g, no peripheral edema GI: soft, non-tender, non-distended, no hepatosplenomegaly SKIN: warm and dry, no rashes or lesions NEURO: II-XII grossly intact, normal gait, peripheral sensation intact PSYCH: AAOx3, appropriate affect  Assessment and plan:  Routine physical examination Patient presents because a routine physical exam is required to enter his group home. Mom has a stack of paperwork which she is working on to have him admitted to the group home. He has overdue vaccinations which are completed today. He is otherwise doing well. A letter was provided stating that he had a physical exam. Mom was advised to drop off any additional physical forms that may be  required as we may be able to fill these out without an additional appointment.    Everrett Coombe, MD,MS,  PGY3 01/27/2019 2:32 PM

## 2019-01-27 NOTE — Assessment & Plan Note (Signed)
Patient presents because a routine physical exam is required to enter his group home. Mom has a stack of paperwork which she is working on to have him admitted to the group home. He has overdue vaccinations which are completed today. He is otherwise doing well. A letter was provided stating that he had a physical exam. Mom was advised to drop off any additional physical forms that may be required as we may be able to fill these out without an additional appointment.

## 2019-02-01 DIAGNOSIS — I517 Cardiomegaly: Secondary | ICD-10-CM | POA: Diagnosis present

## 2019-02-01 DIAGNOSIS — Z0000 Encounter for general adult medical examination without abnormal findings: Secondary | ICD-10-CM | POA: Diagnosis not present

## 2019-02-01 DIAGNOSIS — Z23 Encounter for immunization: Secondary | ICD-10-CM | POA: Diagnosis not present

## 2019-02-01 NOTE — Addendum Note (Signed)
Addended by: Londell Moh T on: 02/01/2019 12:24 PM   Modules accepted: Orders, SmartSet

## 2019-02-02 ENCOUNTER — Ambulatory Visit: Payer: Medicaid Other | Admitting: Family Medicine

## 2019-04-12 ENCOUNTER — Other Ambulatory Visit: Payer: Self-pay

## 2019-04-12 ENCOUNTER — Emergency Department (HOSPITAL_COMMUNITY): Payer: Medicaid Other

## 2019-04-12 ENCOUNTER — Encounter (HOSPITAL_COMMUNITY): Payer: Self-pay | Admitting: Emergency Medicine

## 2019-04-12 ENCOUNTER — Emergency Department (HOSPITAL_COMMUNITY)
Admission: EM | Admit: 2019-04-12 | Discharge: 2019-04-13 | Disposition: A | Discharge status: Home / Self Care | Payer: Medicaid Other | Attending: Emergency Medicine | Admitting: Emergency Medicine

## 2019-04-12 DIAGNOSIS — Z79899 Other long term (current) drug therapy: Secondary | ICD-10-CM | POA: Insufficient documentation

## 2019-04-12 DIAGNOSIS — F79 Unspecified intellectual disabilities: Secondary | ICD-10-CM | POA: Insufficient documentation

## 2019-04-12 DIAGNOSIS — R0789 Other chest pain: Secondary | ICD-10-CM | POA: Diagnosis not present

## 2019-04-12 DIAGNOSIS — R0602 Shortness of breath: Secondary | ICD-10-CM | POA: Diagnosis not present

## 2019-04-12 DIAGNOSIS — R079 Chest pain, unspecified: Secondary | ICD-10-CM

## 2019-04-12 LAB — CBC
HCT: 46 % (ref 39.0–52.0)
Hemoglobin: 14.9 g/dL (ref 13.0–17.0)
MCH: 30.2 pg (ref 26.0–34.0)
MCHC: 32.4 g/dL (ref 30.0–36.0)
MCV: 93.1 fL (ref 80.0–100.0)
Platelets: 296 10*3/uL (ref 150–400)
RBC: 4.94 MIL/uL (ref 4.22–5.81)
RDW: 14.6 % (ref 11.5–15.5)
WBC: 9.5 10*3/uL (ref 4.0–10.5)
nRBC: 0 % (ref 0.0–0.2)

## 2019-04-12 LAB — BASIC METABOLIC PANEL
Anion gap: 10 (ref 5–15)
BUN: 6 mg/dL (ref 6–20)
CO2: 27 mmol/L (ref 22–32)
Calcium: 9.4 mg/dL (ref 8.9–10.3)
Chloride: 101 mmol/L (ref 98–111)
Creatinine, Ser: 0.76 mg/dL (ref 0.61–1.24)
GFR calc Af Amer: >60 mL/min (ref >60)
GFR calc non Af Amer: >60 mL/min (ref >60)
Glucose, Bld: 111 mg/dL — ABNORMAL HIGH (ref 70–99)
Potassium: 3.5 mmol/L (ref 3.5–5.1)
Sodium: 138 mmol/L (ref 135–145)

## 2019-04-12 LAB — TROPONIN I (HIGH SENSITIVITY): Troponin I (High Sensitivity): 4 ng/L (ref <18)

## 2019-04-12 MED ORDER — SODIUM CHLORIDE 0.9% FLUSH: 3.0000 mL | Freq: Once | INTRAVENOUS | Status: DC

## 2019-04-12 NOTE — ED Triage Notes (Signed)
Pt reports central chest pain that began after he ate tonight, pt also endorses sob. States that he has a hx of the same but is unsure if he's ever seen a doctor for it or not. resp e/u, nad.

## 2019-04-13 LAB — TROPONIN I (HIGH SENSITIVITY): Troponin I (High Sensitivity): 5 ng/L (ref <18)

## 2019-04-13 MED ORDER — FAMOTIDINE 20 MG PO TABS: 20.0000 mg | ORAL_TABLET | Freq: Two times a day (BID) | ORAL | 0 refills | Status: DC

## 2019-04-13 MED ORDER — PANTOPRAZOLE SODIUM 40 MG PO TBEC: 40.0000 mg | DELAYED_RELEASE_TABLET | Freq: Every day | ORAL | 0 refills | Status: DC

## 2019-04-13 NOTE — Discharge Instructions (Signed)
If you develop recurrent, continued, or worsening chest pain, shortness of breath, fever, vomiting, abdominal or back pain, or any other new/concerning symptoms then return to the ER for evaluation.  

## 2019-04-13 NOTE — ED Provider Notes (Signed)
[facility]   CSN: [id number] Arrival date & time: 04/12/19  2204     History   Chief Complaint Chief Complaint  Patient presents with  . Chest Pain  . Shortness of Breath    HPI Ruben PughJohn A Mack is a 23 y.o. male.     HPI  23 year old male presents with chest pain.  Started last night sometime after eating dinner.  He states he had pizza last night.  He has had this chest pain on and off since he was 23 years old.  Seems like this pain is not particularly different.  Feels like lightning struck him in his chest.  He also had some shortness of breath.  The pain is not as bad as when it first started though still present.  No leg swelling or leg pain.  No abdominal pain.  He did not take any medicines for this.  Past Medical History:  Diagnosis Date  . 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 Cristy FolksGregory Fleming ([facility])  . Mental disability    intellectual  . Shortness of breath dyspnea    uses inhaler    Patient Active Problem List   Diagnosis Date Noted  . Left ventricular hypertrophy 01/27/2019  . Routine physical examination 01/27/2019  . Short stature 01/26/2013  . 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 MOUTH SURGERY     palate adjustment  . TOOTH EXTRACTION N/A 03/03/2015   Procedure: WISDOM EXTRACTIONS;  Surgeon: Ocie DoyneScott Jensen, DDS;  [facility];  Service: Oral Surgery;  Laterality: N/A;        Home Medications    Prior to Admission medications   Medication Sig Start Date End Date Taking? Authorizing Provider  famotidine (PEPCID) 20 MG tablet Take 1 tablet (20 mg total) by mouth  2 (two) times daily. 04/13/19   Pricilla LovelessGoldston, Cristan Hout, MD  oxyCODONE-acetaminophen (PERCOCET) 5-325 MG per tablet Take 1-2 tablets by mouth every 4 (four) hours as needed. Patient not taking: Reported on 11/20/2018 03/03/15   Ocie DoyneJensen, Leviathan Macera, DDS  pantoprazole (PROTONIX) 40 MG tablet Take 1 tablet (40 mg total) by mouth daily. 04/13/19   Pricilla LovelessGoldston, Caitlynne Harbeck, MD    Family History Family History  Problem Relation Age of Onset  . Cancer Other     Social History Social History   Tobacco Use  . Smoking status: Never Smoker  . Smokeless tobacco: Never Used  Substance Use Topics  . Alcohol use: No  . Drug use: No     Allergies   Poison ivy extract   Review of Systems Review of Systems  Respiratory: Positive for shortness of breath.   Cardiovascular: Positive for chest pain. Negative for leg swelling.  Gastrointestinal: Negative for abdominal pain.  All other systems reviewed and are negative.    Physical Exam Updated Vital Signs BP 107/79 (BP Location: Left Arm)   Pulse 70   Temp 98 F (36.7 C) (Oral)   Resp 18   SpO2 100%   Physical Exam Vitals signs and nursing note reviewed.  Constitutional:      General: He is not in acute distress.    Appearance: He is well-developed. He is not ill-appearing or diaphoretic.  HENT:  Head: Normocephalic and atraumatic.     Right Ear: External ear normal.     Left Ear: External ear normal.     Nose: Nose normal.  Eyes:     General:        Right eye: No discharge.        Left eye: No discharge.  Neck:     Musculoskeletal: Neck supple.  Cardiovascular:     Rate and Rhythm: Normal rate and regular rhythm.     Heart sounds: Normal heart sounds.  Pulmonary:     Effort: Pulmonary effort is normal.     Breath sounds: Normal breath sounds.  Chest:     Chest wall: No tenderness.  Abdominal:     Palpations: Abdomen is soft.     Tenderness: There is no abdominal tenderness.  Skin:    General: Skin is warm and dry.  Neurological:      Mental Status: He is alert.  Psychiatric:        Mood and Affect: Mood is not anxious.      ED Treatments / Results  Labs (all labs ordered are listed, but only abnormal results are displayed) Labs Reviewed  BASIC METABOLIC PANEL - Abnormal; Notable for the following components:      Result Value   Glucose, Bld 111 (*)    All other components within normal limits  CBC  TROPONIN I (HIGH SENSITIVITY)  TROPONIN I (HIGH SENSITIVITY)    EKG EKG Interpretation  Date/Time:  Monday April 12 2019 22:16:53 EDT Ventricular Rate:  82 PR Interval:  112 QRS Duration: 72 QT Interval:  330 QTC Calculation: 385 R Axis:   92 Text Interpretation:  Normal sinus rhythm Rightward axis Nonspecific T wave abnormality Abnormal ECG Confirmed by Pricilla Loveless [phone]) on 04/13/2019 6:58:29 AM   Radiology Dg Chest 2 View  Result Date: 04/12/2019 CLINICAL DATA:  Chest pain EXAM: CHEST - 2 VIEW COMPARISON:  01/04/2019 FINDINGS: The heart size and mediastinal contours are within normal limits. Both lungs are clear. The visualized skeletal structures are unremarkable. IMPRESSION: No active cardiopulmonary disease. Electronically Signed   By: Jasmine Pang M.D.   On: 04/12/2019 22:51    Procedures Procedures (including critical care time)  Medications Ordered in ED Medications  sodium chloride flush (NS) 0.9 % injection 3 mL (has no administration in time range)     Initial Impression / Assessment and Plan / ED Course  I have reviewed the triage vital signs and the nursing notes.  Pertinent labs & imaging results that were available during my care of the patient were reviewed by me and considered in my medical decision making (see chart for details).        Patient appears well.  My suspicion for ACS is pretty low.  He has some nonspecific flat T waves but no T wave inversions or ST changes.  With troponins negative x2, I think he is stable for outpatient follow-up with his PCP.  Much more  likely this is GERD.  I will start him on Protonix and Pepcid.  Highly doubt dissection or PE. PERC negative. Return precautions.  Final Clinical Impressions(s) / ED Diagnoses   Final diagnoses:  Nonspecific chest pain    ED Discharge Orders         Ordered    pantoprazole (PROTONIX) 40 MG tablet  Daily     04/13/19 0810    famotidine (PEPCID) 20 MG tablet  2 times daily     04/13/19 0810  Sherwood Gambler, MD 04/13/19 [id number]

## 2019-04-13 NOTE — ED Notes (Signed)
Pt verbalized understanding of d/c instructions and has no furhter questions, VSS< NAD>

## 2019-05-05 ENCOUNTER — Other Ambulatory Visit: Payer: Self-pay

## 2019-05-05 ENCOUNTER — Emergency Department (HOSPITAL_COMMUNITY)
Admission: EM | Admit: 2019-05-05 | Discharge: 2019-05-06 | Disposition: A | Discharge status: Home / Self Care | Payer: Medicaid Other | Attending: Emergency Medicine | Admitting: Emergency Medicine

## 2019-05-05 ENCOUNTER — Emergency Department (HOSPITAL_COMMUNITY): Payer: Medicaid Other

## 2019-05-05 DIAGNOSIS — R42 Dizziness and giddiness: Secondary | ICD-10-CM | POA: Insufficient documentation

## 2019-05-05 DIAGNOSIS — Z79899 Other long term (current) drug therapy: Secondary | ICD-10-CM | POA: Insufficient documentation

## 2019-05-05 DIAGNOSIS — Q02 Microcephaly: Secondary | ICD-10-CM | POA: Diagnosis not present

## 2019-05-05 DIAGNOSIS — F79 Unspecified intellectual disabilities: Secondary | ICD-10-CM | POA: Diagnosis not present

## 2019-05-05 DIAGNOSIS — R0789 Other chest pain: Secondary | ICD-10-CM | POA: Diagnosis present

## 2019-05-05 DIAGNOSIS — R079 Chest pain, unspecified: Secondary | ICD-10-CM

## 2019-05-05 LAB — BASIC METABOLIC PANEL
Anion gap: 11 (ref 5–15)
BUN: 5 mg/dL — ABNORMAL LOW (ref 6–20)
CO2: 24 mmol/L (ref 22–32)
Calcium: 9.3 mg/dL (ref 8.9–10.3)
Chloride: 104 mmol/L (ref 98–111)
Creatinine, Ser: 0.7 mg/dL (ref 0.61–1.24)
GFR calc Af Amer: >60 mL/min (ref >60)
GFR calc non Af Amer: >60 mL/min (ref >60)
Glucose, Bld: 102 mg/dL — ABNORMAL HIGH (ref 70–99)
Potassium: 4 mmol/L (ref 3.5–5.1)
Sodium: 139 mmol/L (ref 135–145)

## 2019-05-05 LAB — CBC
HCT: 46.3 % (ref 39.0–52.0)
Hemoglobin: 15 g/dL (ref 13.0–17.0)
MCH: 30.1 pg (ref 26.0–34.0)
MCHC: 32.4 g/dL (ref 30.0–36.0)
MCV: 93 fL (ref 80.0–100.0)
Platelets: 331 10*3/uL (ref 150–400)
RBC: 4.98 MIL/uL (ref 4.22–5.81)
RDW: 14.4 % (ref 11.5–15.5)
WBC: 8.3 10*3/uL (ref 4.0–10.5)
nRBC: 0 % (ref 0.0–0.2)

## 2019-05-05 LAB — TROPONIN I (HIGH SENSITIVITY)
Troponin I (High Sensitivity): 4 ng/L (ref <18)
Troponin I (High Sensitivity): 4 ng/L (ref <18)

## 2019-05-05 MED ORDER — SODIUM CHLORIDE 0.9% FLUSH: 3.0000 mL | Freq: Once | INTRAVENOUS | Status: DC

## 2019-05-05 NOTE — ED Notes (Signed)
Ruben Mack  [place] mother

## 2019-05-05 NOTE — ED Triage Notes (Signed)
Pt presents with chest pan, dizziness and feeling like he was going to pass out x30 mins

## 2019-05-06 NOTE — ED Provider Notes (Signed)
[facility] EMERGENCY DEPARTMENT Provider Note   CSN: [id number] Arrival date & time: 05/05/19  1951     History   Chief Complaint Chief Complaint  Patient presents with  . Chest Pain  . Dizziness    HPI JEFFREN DOMBEK is a 23 y.o. male.     The history is provided by the patient and medical records.     23 y.o. M with hx of hypertrophic cardiomyopathy, mental disability, presenting to the ED with chest pain. This began approx 4:30 PM yesterday afternoon (approx 13 hours ago).  He states pain is left sided, feels like someone is "throwing something" at his chest.  He denies SOB, diaphoresis, nausea, vomiting, etc. He is followed by specialist at [facility], saw them in May 2020 with normal exam at that time.  He is not a smoker.  He denies illicit drug use.  No fever, chills, cough, or recent URI symptoms.  He states he does get chest pain quite often, this episode feels somewhat different but unable to discern how this is different.  Past Medical History:  Diagnosis Date  . 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 Kathie Rhodes ([place])  . Mental disability    intellectual  . Shortness of breath dyspnea    uses inhaler    Patient Active Problem List   Diagnosis Date Noted  . Left ventricular hypertrophy 01/27/2019  . Routine physical examination 01/27/2019  . Short stature 01/26/2013  . 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 Congenital microcephaly ([place]) 01/26/2013    Past Surgical History:  Procedure Laterality Date  . MOUTH SURGERY     palate adjustment  . TOOTH EXTRACTION N/A 03/03/2015   Procedure: WISDOM EXTRACTIONS;  Surgeon: Diona Browner, DDS;  Location: [facility];  Service: Oral Surgery;  Laterality: N/A;         Home Medications    Prior to Admission medications   Medication Sig Start Date End Date Taking? Authorizing Provider  famotidine (PEPCID) 20 MG tablet Take 1 tablet (20 mg total) by mouth 2 (two) times daily. 04/13/19   Sherwood Gambler, MD  oxyCODONE-acetaminophen (PERCOCET) 5-325 MG per tablet Take 1-2 tablets by mouth every 4 (four) hours as needed. Patient not taking: Reported on 11/20/2018 03/03/15   Diona Browner, DDS  pantoprazole (PROTONIX) 40 MG tablet Take 1 tablet (40 mg total) by mouth daily. 04/13/19   Sherwood Gambler, MD    Family History Family History  Problem Relation Age of Onset  . Cancer Other     Social History Social History   Tobacco Use  . Smoking status: Never Smoker  . Smokeless tobacco: Never Used  Substance Use Topics  . Alcohol use: No  . Drug use: No     Allergies   Poison ivy extract   Review of Systems Review of Systems  Cardiovascular: Positive for chest pain.  All other systems reviewed and are negative.    Physical Exam Updated Vital Signs BP (!) 102/58   Pulse 72   Temp 98.2 F (36.8 C) (Oral)   Resp 18   SpO2 98%   Physical Exam Vitals signs and nursing note reviewed.  Constitutional:      Appearance: He is well-developed.  HENT:     Head: Normocephalic and atraumatic.  Eyes:  Conjunctiva/sclera: Conjunctivae normal.     Pupils: Pupils are equal, round, and reactive to light.  Neck:     Musculoskeletal: Normal range of motion.  Cardiovascular:     Rate and Rhythm: Normal rate and regular rhythm.     Heart sounds: Normal heart sounds.  Pulmonary:     Effort: Pulmonary effort is normal.     Breath sounds: Normal breath sounds. No decreased breath sounds or wheezing.  Chest:       Comments: Mild tenderness of chest wall, no deformity noted Abdominal:     General: Bowel sounds are normal.     Palpations: Abdomen is soft.  Musculoskeletal: Normal range of motion.  Skin:    General: Skin is warm and dry.   Neurological:     Mental Status: He is alert and oriented to person, place, and time.      ED Treatments / Results  Labs (all labs ordered are listed, but only abnormal results are displayed) Labs Reviewed  BASIC METABOLIC PANEL - Abnormal; Notable for the following components:      Result Value   Glucose, Bld 102 (*)    BUN 5 (*)    All other components within normal limits  CBC  TROPONIN I (HIGH SENSITIVITY)  TROPONIN I (HIGH SENSITIVITY)    EKG EKG Interpretation  Date/Time:  Thursday May 06 2019 05:54:53 EDT Ventricular Rate:  64 PR Interval:    QRS Duration: 79 QT Interval:  407 QTC Calculation: 420 R Axis:   102 Text Interpretation:  Sinus rhythm Borderline right axis deviation No significant change since last tracing Confirmed by Melene Plan [phone]) on 05/06/2019 6:07:28 AM   Radiology Dg Chest 2 View  Result Date: 05/05/2019 CLINICAL DATA:  Chest pain EXAM: CHEST - 2 VIEW COMPARISON:  April 12, 2019 FINDINGS: Lungs are clear. Heart size and pulmonary vascularity are normal. No adenopathy. No pneumothorax. There is upper lumbar levoscoliosis. 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 IMPRESSION: No edema or consolidation.  Stable cardiac silhouette. Electronically Signed   By: Bretta Bang III M.D.   On: 05/05/2019 20:59    Procedures Procedures (including critical care time)  Medications Ordered in ED Medications  sodium chloride flush (NS) 0.9 % injection 3 mL (3 mLs Intravenous Not Given 05/06/19 0459)     Initial Impression / Assessment and Plan / ED Course  I have reviewed the triage vital signs and the nursing notes.  Pertinent labs & imaging results that were available during my care of the patient were reviewed by me and considered in my medical decision making (see chart for details).  23 year old male presenting to the ED with chest pain.  He has a history of hypertrophic cardiomyopathy and is followed by cardiology at [facility].  Pain is left-sided, feels  like someone is hitting over the chest with something.  Does report he gets chest pain quite often but this feels different.  He is unable to discern how this is different.  His EKG here is overall reassuring.  Labs are within normal limits including troponin x2.  CXR is clear.  Patient's pain is atypical and reproducible on exam, low suspicion for ACS, PE, dissection, or other acute cardiac event.  Feel he is stable for discharge home to follow-up with his cardiologist.  He may return here for any new/acute changes.  Final Clinical Impressions(s) / ED Diagnoses   Final diagnoses:  Chest pain in adult    ED Discharge Orders    None  Garlon HatchetSanders, Victorian Gunn M, PA-C 05/06/19 [id number]    Melene PlanFloyd, Dan, DO 05/06/19 [phone]

## 2019-05-06 NOTE — Discharge Instructions (Addendum)
Your cardiac tests today were normal. Follow-up with your cardiologist at [facility]. You may return here for any new/acute changes.

## 2019-05-20 NOTE — Progress Notes (Signed)
Cardiology Office Note:    Date:  05/21/2019   ID:  Ruben Mack, DOB 01/02/1996, MRN [id number]  PCP:  Marthenia Rolling, DO  Cardiologist:  Norman Herrlich, MD   Referring MD: Marthenia Rolling, DO  ASSESSMENT:    1. Noncompaction cardiomyopathy (HCC)   2. Family history of sudden cardiac death    PLAN:    In order of problems listed above:  1. He has well-documented non-compaction cardiomyopathy from pediatric cardiology at [facility].  He continues to be symptomatic with chest pain.  We discussed and will perform further evaluation with cardiac MR looking for late gadolinium enhancement and perform a 1 week ZIO monitor to screen for ventricular tachycardia with his family history of sudden cardiac death.  Unfortunately there is no specific treatment for his problem this time I would not initiate drug therapy unless he has developed cardiomyopathy as a ventricular thrombus or identifiable arrhythmia.  Patient and his mother are comfortable with the approach.  He will come back in the office in 6 weeks review findings and formulate a final treatment plan  Next appointment 6 weeks  Medication Adjustments/Labs and Tests Ordered: Current medicines are reviewed at length with the patient today.  Concerns regarding medicines are outlined above.  No orders of the defined types were placed in this encounter.  No orders of the defined types were placed in this encounter.    Chief Complaint  Patient presents with  . Cardiomyopathy    History of Present Illness:    Ruben Mack is a 23 y.o. male who is being seen today for the evaluation of chest pain at the request of Marthenia Rolling, DO. He was seen in the med center [facility] ED 05/05/2019 for chest pain.  Echocardiogram performed April of this year was interpreted as normal biventricular systolic function moderately trabeculated left ventricle and non-compaction of the left ventricle the left echo and multiple previous studies  back to 2012 showed  the same pattern.  His mother is present he is diagnosed at age 11 when he was playing football and had chest pain.  He has a family history of cardiomyopathy unspecified as well as sudden cardiac death and 3 uncles one at age 63.  He has not had syncope but says he has intermittent palpitation and 1-2 times a month he will have chest pain nonexertional substernal severe and resolve spontaneously.  No shortness of breath or edema.  Viewed with the patient and his mother that if sure the diagnosis is correct it was made at [facility] but I think there is value in a cardiac MRI for prognosis looking for late gadolinium enhancement and other unrecognized congenital heart disease they are interested and will schedule it.  Also apply 1 week ZIO monitor to screen for arrhythmia with his family history of sudden cardiac death.  There is no other specific treatment for his problem and fortunately with his cognitive impairment he lives with the mother and is well supervised.  Past Medical History:  Diagnosis Date  . 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 Cristy Folks ([facility])  . Mental disability    intellectual  . Shortness of breath dyspnea    uses inhaler    Past Surgical History:  Procedure Laterality Date  . CLEFT PALATE REPAIR    . MOUTH SURGERY     palate adjustment  . TOOTH EXTRACTION N/A 03/03/2015   Procedure: [place]  EXTRACTIONS;  Surgeon: Ocie Doyne, DDS;  Location: [facility] OR;  Service: Oral Surgery;  Laterality: N/A;    Current Medications: Current Meds  Medication Sig  . oxyCODONE-acetaminophen (PERCOCET) 5-325 MG per tablet Take 1-2 tablets by mouth every 4 (four) hours as needed.  . penicillin v potassium (VEETID) 500 MG tablet Take 1 tablet by mouth 3 (three) times daily.     Allergies:   Poison ivy extract   Social History   Socioeconomic History  . Marital status: Single    Spouse name:  Not on file  . Number of children: Not on file  . Years of education: Not on file  . Highest education level: Not on file  Occupational History  . Not on file  Social Needs  . Financial resource strain: Not on file  . Food insecurity    Worry: Not on file    Inability: Not on file  . Transportation needs    Medical: Not on file    Non-medical: Not on file  Tobacco Use  . Smoking status: Never Smoker  . Smokeless tobacco: Never Used  Substance and Sexual Activity  . Alcohol use: No  . Drug use: No  . Sexual activity: Not on file  Lifestyle  . Physical activity    Days per week: Not on file    Minutes per session: Not on file  . Stress: Not on file  Relationships  . Social Musician on phone: Not on file    Gets together: Not on file    Attends religious service: Not on file    Active member of club or organization: Not on file    Attends meetings of clubs or organizations: Not on file    Relationship status: Not on file  Other Topics Concern  . Not on file  Social History Narrative  . Not on file     Family History: The patient's family history includes Cancer in an other family member; Diabetes in his mother; Hyperlipidemia in his mother; Thyroid disease in his mother.  ROS:   ROS Please see the history of present illness.     All other systems reviewed and are negative.  EKGs/Labs/Other Studies Reviewed:    The following studies were reviewed today  Recent Labs: 12/22/2018: ALT 16 05/05/2019: BUN 5; Creatinine, Ser 0.70; Hemoglobin 15.0; Platelets 331; Potassium 4.0; Sodium 139  Recent Lipid Panel No results found for: CHOL, TRIG, HDL, CHOLHDL, VLDL, LDLCALC, LDLDIRECT  Physical Exam:    VS:  BP 96/70 (BP Location: Left Arm, Patient Position: Sitting, Cuff Size: Small)   Pulse 74   Temp (!) 96.9 F (36.1 C)   Ht 5\' 2"  (1.575 m)   Wt 102 lb 12.8 oz (46.6 kg)   SpO2 99%   BMI 18.80 kg/m     Wt Readings from Last 3 Encounters:  05/21/19 102  lb 12.8 oz (46.6 kg)  01/13/18 110 lb (49.9 kg)  03/03/15 99 lb 4 oz (45 kg) (<1 %, Z= -3.35)*   * Growth percentiles are based on CDC (Boys, 2-20 Years) data.     GEN: Diminutive well nourished, well developed in no acute distress HEENT: Normal NECK: No JVD; No carotid bruits LYMPHATICS: No lymphadenopathy CARDIAC: This carinatum deformity RRR, no murmurs, rubs, gallops RESPIRATORY:  Clear to auscultation without rales, wheezing or rhonchi  ABDOMEN: Soft, non-tender, non-distended MUSCULOSKELETAL:  No edema; No deformity  SKIN: Warm and dry NEUROLOGIC:  Alert and oriented x 3 PSYCHIATRIC:  Normal affect     Signed, Shirlee More, MD  05/21/2019 9:41 AM    [facility]

## 2019-05-21 ENCOUNTER — Other Ambulatory Visit: Payer: Self-pay

## 2019-05-21 ENCOUNTER — Encounter: Payer: Self-pay | Admitting: Cardiology

## 2019-05-21 ENCOUNTER — Ambulatory Visit (INDEPENDENT_AMBULATORY_CARE_PROVIDER_SITE_OTHER): Payer: Medicaid Other | Admitting: Cardiology

## 2019-05-21 VITALS — BP 96/70 | HR 74 | Temp 96.9°F | Ht 62.0 in | Wt 102.8 lb

## 2019-05-21 DIAGNOSIS — Z8241 Family history of sudden cardiac death: Secondary | ICD-10-CM | POA: Diagnosis not present

## 2019-05-21 DIAGNOSIS — I428 Other cardiomyopathies: Secondary | ICD-10-CM | POA: Insufficient documentation

## 2019-05-21 HISTORY — DX: Other cardiomyopathies: I42.8

## 2019-05-21 NOTE — Patient Instructions (Signed)
Medication Instructions:  Your physician recommends that you continue on your current medications as directed. Please refer to the Current Medication list given to you today.  If you need a refill on your cardiac medications before your next appointment, please call your pharmacy.   Lab work: None  If you have labs (blood work) drawn today and your tests are completely normal, you will receive your results only by: Marland Kitchen MyChart Message (if you have MyChart) OR . A paper copy in the mail If you have any lab test that is abnormal or we need to change your treatment, we will call you to review the results.  Testing/Procedures: Your physician has requested that you have a cardiac MRI. Cardiac MRI uses a computer to create images of your heart as its beating, producing both still and moving pictures of your heart and major blood vessels. For further information please visit [email or web address] Please follow the instruction sheet given to you today for more information. You will be called to scheduled this at [facility].   Your physician has recommended that you wear a ZIO monitor. ZIO monitors are medical devices that record the heart's electrical activity. Doctors most often use these monitors to diagnose arrhythmias. Arrhythmias are problems with the speed or rhythm of the heartbeat. The monitor is a small, portable device. You can wear one while you do your normal daily activities. This is usually used to diagnose what is causing palpitations/syncope (passing out). Wear for 7 days.   Follow-Up: At [facility], you and your health needs are our priority.  As part of our continuing mission to provide you with exceptional heart care, we have created designated Provider Care Teams.  These Care Teams include your primary Cardiologist (physician) and Advanced Practice Providers (APPs -  Physician Assistants and Nurse Practitioners) who all work together to provide you with the care you need, when  you need it. You will need a follow up appointment in 6 weeks.

## 2019-06-02 ENCOUNTER — Encounter: Payer: Self-pay | Admitting: *Deleted

## 2019-06-02 ENCOUNTER — Telehealth: Payer: Self-pay | Admitting: *Deleted

## 2019-06-02 NOTE — Telephone Encounter (Signed)
Called and gave appointment information for Cardiac MRI scheduled 06/16/19 at 11:00 am at [facility].  Arrival time is 10:15 am at the 1st floor admissions office.  Will also mail information to patient.

## 2019-06-14 ENCOUNTER — Telehealth (HOSPITAL_COMMUNITY): Payer: Self-pay | Admitting: Emergency Medicine

## 2019-06-14 NOTE — Telephone Encounter (Signed)
attempted phone call by Midori Dado. Unable to leave VM, VM box full; also attempted to call Mom but she hung up

## 2019-06-16 ENCOUNTER — Ambulatory Visit (HOSPITAL_COMMUNITY)
Admission: RE | Admit: 2019-06-16 | Discharge: 2019-06-16 | Disposition: A | Discharge status: Home / Self Care | Payer: Medicaid Other | Source: Ambulatory Visit | Attending: Cardiology | Admitting: Cardiology

## 2019-06-16 ENCOUNTER — Other Ambulatory Visit: Payer: Self-pay

## 2019-06-16 DIAGNOSIS — I428 Other cardiomyopathies: Secondary | ICD-10-CM | POA: Insufficient documentation

## 2019-06-16 DIAGNOSIS — Z8241 Family history of sudden cardiac death: Secondary | ICD-10-CM | POA: Diagnosis present

## 2019-06-16 MED ORDER — GADOBUTROL 1 MMOL/ML IV SOLN
7.0000 mL | Freq: Once | INTRAVENOUS | Status: AC | PRN
  Administered 2019-06-16: 7 mL via INTRAVENOUS

## 2019-06-18 ENCOUNTER — Ambulatory Visit (INDEPENDENT_AMBULATORY_CARE_PROVIDER_SITE_OTHER): Payer: Medicaid Other

## 2019-06-18 DIAGNOSIS — I428 Other cardiomyopathies: Secondary | ICD-10-CM

## 2019-06-18 DIAGNOSIS — Z8241 Family history of sudden cardiac death: Secondary | ICD-10-CM

## 2019-06-30 ENCOUNTER — Encounter: Payer: Self-pay | Admitting: Family

## 2019-06-30 ENCOUNTER — Ambulatory Visit (INDEPENDENT_AMBULATORY_CARE_PROVIDER_SITE_OTHER): Payer: Medicaid Other | Admitting: Family

## 2019-06-30 ENCOUNTER — Other Ambulatory Visit: Payer: Self-pay

## 2019-06-30 VITALS — BP 102/68 | HR 90 | Temp 98.4°F | Ht 62.0 in | Wt 102.0 lb

## 2019-06-30 DIAGNOSIS — Z8241 Family history of sudden cardiac death: Secondary | ICD-10-CM | POA: Diagnosis not present

## 2019-06-30 DIAGNOSIS — R002 Palpitations: Secondary | ICD-10-CM

## 2019-06-30 DIAGNOSIS — I428 Other cardiomyopathies: Secondary | ICD-10-CM | POA: Diagnosis not present

## 2019-06-30 NOTE — Progress Notes (Signed)
Office Visit    Patient Name: Ruben Mack Date of Encounter: 06/30/2019  Primary Care Provider:  Sherene Sires, DO Primary Cardiologist:  Shirlee More, MD Electrophysiologist:  None   Chief Complaint    Ruben Mack is a 23 y.o. male with a hx of non-compaction cardiomyopathy, family history of sudden cardiac death, ADHD presents today for follow up after cardiac MRI.   Past Medical History    Past Medical History:  Diagnosis Date  . 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 Kathie Rhodes ([place])  . Mental disability    intellectual  . Shortness of breath dyspnea    uses inhaler   Past Surgical History:  Procedure Laterality Date  . CLEFT PALATE REPAIR    . MOUTH SURGERY     palate adjustment  . TOOTH EXTRACTION N/A 03/03/2015   Procedure: WISDOM EXTRACTIONS;  Surgeon: Diona Browner, DDS;  Location: [facility];  Service: Oral Surgery;  Laterality: N/A;    Allergies  Allergies  Allergen Reactions  . Poison Ivy Extract Rash and Other (See Comments)    Welts and sneezing, also    History of Present Illness    Ruben Mack is a 23 y.o. male with a hx of non-compaction cardiomyopathy, family history of sudden cardiac death, ADHD  last seen May 28, 2019 by Dr. Bettina Gavia.  He has had multiple ED visits with chest pain. Echo 11/2018 with normal biventricular systolic function, moderately trabeculated LV, and non compaction of LV. Multiple studies back to 2012 with same finding. He was diagnosed at age 44. Family history of unspecified cardiomyopathy and sudden cardiac death of 3 uncles, one at 34 years old.   At time of consult with Dr. Bettina Gavia was recommended for cardiac MRI and ZIO monitor.  Cardiac MRI 06/16/2019 with LV hyper trabeculation with normal LV systolic function which met criteria for noncompaction cardiomyopathy in the setting of family history.  RV normal size and function with  inferior RV slight late gadolinium enhancement.  No additional congenital heart defects noted, no LV thrombus.  Reports no chest pain since last office visit.  Tells me he has intermittent palpitations that are not painful and not associated with shortness of breath.  His ZIO monitor has been mailed back and is on its way to the company.  He reports no shortness of breath, no DOE, no lightheadedness, no near syncope.  He is present today for his visit with his grandmother who is acting as his guardian.  EKGs/Labs/Other Studies Reviewed:   The following studies were reviewed today: Cardiac MRI 06/16/19 IMPRESSION: 1. LV hypertrabeculation. While meets imaging criteria for LV noncompaction (ratio noncompacted to compacted myocardium >2.3 in end diastole), LV systolic function is normal, so would need other clinical manifestation (such as arrhythmia, thromboembolic event, or family history cardiomyopathy) to be considered LV noncompaction cardiomyopathy   2.  Normal LV size, wall thickness, and systolic function (EF 67%)   3.  Normal RV size and systolic function (EF 34%)   4. Inferior RV insertion site late gadolinium enhancement, which is a nonspecific finding and often seen in setting of elevated pulmonary pressures but has been described in LV noncompaction (Nucifora et al. Eur J Heart Fail. 2011 Feb;13(2):170-6)  Echo 12/02/2018 INTERPRETATION SUMMARY   Normal biventricular systolic function   Moderately trabeculated LV apex.   Non-compaction of left ventricle  EKG:  No EKG today.  Recent Labs:  12/22/2018: ALT 16 05/05/2019: BUN 5; Creatinine, Ser 0.70; Hemoglobin 15.0; Platelets 331; Potassium 4.0; Sodium 139  Recent Lipid Panel No results found for: CHOL, TRIG, HDL, CHOLHDL, VLDL, LDLCALC, LDLDIRECT  Home Medications   Current Meds  Medication Sig  . famotidine (PEPCID) 20 MG tablet Take 1 tablet (20 mg total) by mouth 2 (two) times daily.      Review of Systems        Review of Systems  Constitution: Negative for chills, fever and malaise/fatigue.  Cardiovascular: Positive for palpitations. Negative for chest pain, dyspnea on exertion, irregular heartbeat, leg swelling, near-syncope and orthopnea.  Respiratory: Negative for cough, shortness of breath and wheezing.   Gastrointestinal: Negative for nausea and vomiting.  Neurological: Negative for dizziness, light-headedness and weakness.   All other systems reviewed and are otherwise negative except as noted above.  Physical Exam    VS:  BP 102/68 (BP Location: Right Arm, Patient Position: Sitting, Cuff Size: Normal)   Pulse 90   Temp 98.4 F (36.9 C)   Ht 5' 2"  (1.575 m)   Wt 102 lb (46.3 kg)   SpO2 97%   BMI 18.66 kg/m  , BMI Body mass index is 18.66 kg/m. GEN: Well nourished, well developed, in no acute distress. HEENT: normal. Neck: Supple, no JVD, carotid bruits, or masses. Cardiac: RRR, no murmurs, rubs, or gallops. No clubbing, cyanosis, edema.  Radials/DP/PT 2+ and equal bilaterally.  Respiratory:  Respirations regular and unlabored, clear to auscultation bilaterally. GI: Soft, nontender, nondistended, BS + x 4. MS: No deformity or atrophy. Skin: Warm and dry, no rash. Neuro:  Strength and sensation are intact. Psych: Normal affect.  Assessment & Plan    1. Noncompaction cardiomyopathy - Initially dx by pediatric oncology at [facility]. Cardiac MRI 06/16/19 with LV hypertrabeculation with normal LVEF, inferior RV insertion site late gadolinium compaction. Diagnosis confirmed by family history of sudden cardiac death. Reassuring result with no heart failure, LV thrombus. Plan to repeat EKG and echo in 1 year. Educated to report symptoms including SOB, palpitations, chest pain, near-syncope.  2. Family history of sudden cardiac death - Multiple family members with sudden cardiac death including 3 uncles, one at age 65. Per his guardian an uncle who has since passed sent records for [place] to  review but unavailable for review.   3. Palpitations - Await results of ZIO. In setting of noncompaction cardiomyopathy if evidence of VT refer to EP for consideration of ICD.  Disposition: Follow up in 1 year(s) with Dr. Earney Navy, NP 06/30/2019, 3:53 PM

## 2019-06-30 NOTE — Patient Instructions (Signed)
Medication Instructions:  No medication changes today.  *If you need a refill on your cardiac medications before your next appointment, please call your pharmacy*  Lab Work: No lab work today.  If you have labs (blood work) drawn today and your tests are completely normal, you will receive your results only by: Marland Kitchen MyChart Message (if you have MyChart) OR . A paper copy in the mail If you have any lab test that is abnormal or we need to change your treatment, we will call you to review the results.  Testing/Procedures: None today.  Follow-Up: At [facility], you and your health needs are our priority.  As part of our continuing mission to provide you with exceptional heart care, we have created designated Provider Care Teams.  These Care Teams include your primary Cardiologist (physician) and Advanced Practice Providers (APPs -  Physician Assistants and Nurse Practitioners) who all work together to provide you with the care you need, when you need it.  Your next appointment:   12 months  The format for your next appointment:   In Person  Provider:   Shirlee More, MD  Other Instructions  You have a condition called 'non-compaction cardiomyopathy'. We would recommend an echocardiogram (ultrasound of your heart) and EKG annually.  Your ZIO (heart monitor) is on its way back to the company. Expect a phone call with these results in the next week or so.   Report new symptoms of chest pain, shortness of breath, swelling, and near-syncope (almost passing out).

## 2019-07-12 ENCOUNTER — Other Ambulatory Visit: Payer: Self-pay

## 2019-07-12 ENCOUNTER — Ambulatory Visit: Payer: Medicaid Other | Admitting: Cardiology

## 2019-08-13 ENCOUNTER — Other Ambulatory Visit: Payer: Self-pay

## 2019-08-13 ENCOUNTER — Emergency Department (HOSPITAL_COMMUNITY)
Admission: EM | Admit: 2019-08-13 | Discharge: 2019-08-14 | Disposition: A | Discharge status: Home / Self Care | Payer: Medicaid Other | Attending: Emergency Medicine | Admitting: Emergency Medicine

## 2019-08-13 ENCOUNTER — Emergency Department (HOSPITAL_COMMUNITY): Payer: Medicaid Other

## 2019-08-13 ENCOUNTER — Encounter (HOSPITAL_COMMUNITY): Payer: Self-pay | Admitting: Emergency Medicine

## 2019-08-13 DIAGNOSIS — F79 Unspecified intellectual disabilities: Secondary | ICD-10-CM | POA: Diagnosis not present

## 2019-08-13 DIAGNOSIS — R0602 Shortness of breath: Secondary | ICD-10-CM | POA: Insufficient documentation

## 2019-08-13 DIAGNOSIS — R079 Chest pain, unspecified: Secondary | ICD-10-CM

## 2019-08-13 DIAGNOSIS — R0789 Other chest pain: Secondary | ICD-10-CM | POA: Insufficient documentation

## 2019-08-13 MED ORDER — SODIUM CHLORIDE 0.9% FLUSH: 3.0000 mL | Freq: Once | INTRAVENOUS | Status: DC

## 2019-08-13 NOTE — ED Triage Notes (Signed)
Pt reports right sided chest pain that started when he went to bed.  Pt states this pain is in a different location.  Also reports SOB that started when the pain started.

## 2019-08-14 LAB — BASIC METABOLIC PANEL
Anion gap: 11 (ref 5–15)
BUN: <5 mg/dL — ABNORMAL LOW (ref 6–20)
CO2: 25 mmol/L (ref 22–32)
Calcium: 9.7 mg/dL (ref 8.9–10.3)
Chloride: 102 mmol/L (ref 98–111)
Creatinine, Ser: 0.74 mg/dL (ref 0.61–1.24)
GFR calc Af Amer: >60 mL/min (ref >60)
GFR calc non Af Amer: >60 mL/min (ref >60)
Glucose, Bld: 100 mg/dL — ABNORMAL HIGH (ref 70–99)
Potassium: 3.9 mmol/L (ref 3.5–5.1)
Sodium: 138 mmol/L (ref 135–145)

## 2019-08-14 LAB — CBC
HCT: 45.5 % (ref 39.0–52.0)
Hemoglobin: 15 g/dL (ref 13.0–17.0)
MCH: 29.8 pg (ref 26.0–34.0)
MCHC: 33 g/dL (ref 30.0–36.0)
MCV: 90.5 fL (ref 80.0–100.0)
Platelets: 299 10*3/uL (ref 150–400)
RBC: 5.03 MIL/uL (ref 4.22–5.81)
RDW: 14 % (ref 11.5–15.5)
WBC: 7.2 10*3/uL (ref 4.0–10.5)
nRBC: 0 % (ref 0.0–0.2)

## 2019-08-14 LAB — TROPONIN I (HIGH SENSITIVITY): Troponin I (High Sensitivity): <2 ng/L (ref <18)

## 2019-08-14 LAB — D-DIMER, QUANTITATIVE: D-Dimer, Quant: <0.27 ug/mL-FEU (ref 0.00–0.50)

## 2019-08-14 NOTE — ED Notes (Signed)
ED Provider at bedside. 

## 2019-08-14 NOTE — ED Provider Notes (Signed)
[facility] EMERGENCY DEPARTMENT Provider Note   CSN: [id number] Arrival date & time: 08/13/19  2339     History Chief Complaint  Patient presents with  . Chest Pain    Ruben Mack is a 23 y.o. male.  Patient presents to the emergency department with a chief complaint of chest pain.  He states that he began having some chest pain when he went to bed last night.  States that he got up and fell and continued having chest pain.  He did not pass out or lose consciousness.  He denies having any radiating pain.  States that he sometimes has some shortness of breath.  Complains of a mild amount of chest pain now.  No treatments prior to arrival.  History of cardiomyopathy, history of noncompaction LVH and family history of HCM.  Nothing makes the symptoms better or worse.  The history is provided by the patient. No language interpreter was used.       Past Medical History:  Diagnosis Date  . 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 Cristy Folks ([facility])  . Mental disability    intellectual  . Shortness of breath dyspnea    uses inhaler    Patient Active Problem List   Diagnosis Date Noted  . Noncompaction cardiomyopathy (HCC) 05/21/2019  . Left ventricular hypertrophy 01/27/2019  . Routine physical examination 01/27/2019  . Short stature 01/26/2013  . 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 CLEFT PALATE REPAIR    . MOUTH SURGERY     palate adjustment  . TOOTH EXTRACTION N/A 03/03/2015   Procedure: WISDOM EXTRACTIONS;  Surgeon: Ocie Doyne, DDS;  Location: [facility] OR;  Service: Oral Surgery;  Laterality: N/A;       Family  History  Problem Relation Age of Onset  . Hyperlipidemia Mother   . Thyroid disease Mother   . Diabetes Mother   . Cancer Other     Social History   Tobacco Use  . Smoking status: Never Smoker  . Smokeless tobacco: Never Used  Substance Use Topics  . Alcohol use: No  . Drug use: No    Home Medications Prior to Admission medications   Medication Sig Start Date End Date Taking? Authorizing Provider  famotidine (PEPCID) 20 MG tablet Take 1 tablet (20 mg total) by mouth 2 (two) times daily. 04/13/19   Pricilla Loveless, MD  pantoprazole (PROTONIX) 40 MG tablet Take 1 tablet (40 mg total) by mouth daily. Patient not taking: Reported on 05/21/2019 04/13/19   Pricilla Loveless, MD    Allergies    Poison ivy extract  Review of Systems   Review of Systems  All other systems reviewed and are negative.   Physical Exam Updated Vital Signs BP 110/75 (BP Location: Right Arm)   Pulse 68   Temp 98.6 F (37 C) (Oral)   Resp 16   Ht 5' (1.524 m)   SpO2 100%   BMI 19.92 kg/m   Physical Exam Vitals and nursing note reviewed.  Constitutional:      Appearance: He is well-developed.  HENT:     Head: Atraumatic.  Eyes:     Conjunctiva/sclera: Conjunctivae normal.  Cardiovascular:     Rate and Rhythm:  Normal rate and regular rhythm.     Heart sounds: No murmur.  Pulmonary:     Effort: Pulmonary effort is normal. No respiratory distress.     Breath sounds: Normal breath sounds.  Abdominal:     Palpations: Abdomen is soft.     Tenderness: There is no abdominal tenderness.  Musculoskeletal:     Cervical back: Neck supple.  Skin:    General: Skin is warm and dry.  Neurological:     General: No focal deficit present.     Mental Status: He is alert.  Psychiatric:        Mood and Affect: Mood normal.        Behavior: Behavior normal.     ED Results / Procedures / Treatments   Labs (all labs ordered are listed, but only abnormal results are displayed) Labs Reviewed  BASIC  METABOLIC PANEL - Abnormal; Notable for the following components:      Result Value   Glucose, Bld 100 (*)    BUN <5 (*)    All other components within normal limits  CBC  D-DIMER, QUANTITATIVE (NOT AT [facility])  TROPONIN I (HIGH SENSITIVITY)  TROPONIN I (HIGH SENSITIVITY)    EKG EKG Interpretation  Date/Time:  Friday August 13 2019 23:47:29 EST Ventricular Rate:  68 PR Interval:  132 QRS Duration: 74 QT Interval:  382 QTC Calculation: 406 R Axis:   106 Text Interpretation: Normal sinus rhythm Rightward axis Borderline ECG s1q3t3 is not new No significant change since last tracing Confirmed by Derwood Kaplan [phone]) on 08/14/2019 4:58:56 AM   Radiology DG Chest Portable 1 View  Result Date: 08/14/2019 CLINICAL DATA:  Right-sided chest pain EXAM: PORTABLE CHEST 1 VIEW COMPARISON:  Radiograph 05/05/2019 FINDINGS: Mild dextrocurvature of the spine similar to priors. No consolidation or features of edema. No visible effusion the portions of the costophrenic sulci are collimated. No pneumothorax. Pulmonary vascularity is normally distributed. The cardiomediastinal contours are unremarkable. No acute osseous or soft tissue abnormality. IMPRESSION: No acute cardiopulmonary abnormality. Electronically Signed   By: Kreg Shropshire M.D.   On: 08/14/2019 00:08    Procedures Procedures (including critical care time)  Medications Ordered in ED Medications  sodium chloride flush (NS) 0.9 % injection 3 mL (has no administration in time range)    ED Course  I have reviewed the triage vital signs and the nursing notes.  Pertinent labs & imaging results that were available during my care of the patient were reviewed by me and considered in my medical decision making (see chart for details).    MDM Rules/Calculators/A&P                      Patient with reported chest pain that started when he went to bed.  States that pain has moved in locations.  He also reports some shortness of breath.   His vitals are noted to be normal.  He is not hypoxic.  He is not tachycardic.  He does not have any PE risk factors.  S1Q3T3 seen on EKG, but this is not new D-dimer is negative, I doubt PE.  Chest x-ray shows no evidence of infection.  Initial troponin is less than 2.  He is low risk for ACS given age and lack of risk factors.  He is noted to have family history of HCM, though no first-degree relatives.  He is not hypotensive.  I recommend that he follow-up with his primary care doctor.  5:27 AM  I attempted to contact the patient's guardian, but was unsuccessful. Unable to leave message due to answering machine not set up.   Final Clinical Impression(s) / ED Diagnoses Final diagnoses:  Chest pain, unspecified type    Rx / DC Orders ED Discharge Orders    None       Montine Circle, PA-C 08/14/19 [place], Ankit, MD 08/14/19 2313

## 2019-08-14 NOTE — Discharge Instructions (Addendum)
No clear cause for your symptoms was found tonight.  Your laboratory work-up, heart rhythm, and chest x-ray were all reassuring.  Please follow-up with your regular doctor.  If your symptoms worsen, please return to the emergency department.

## 2019-09-08 ENCOUNTER — Encounter: Payer: Self-pay | Admitting: Family Medicine

## 2019-09-08 ENCOUNTER — Telehealth: Payer: Self-pay | Admitting: Family Medicine

## 2019-09-08 ENCOUNTER — Ambulatory Visit (INDEPENDENT_AMBULATORY_CARE_PROVIDER_SITE_OTHER): Payer: Medicaid Other | Admitting: Family Medicine

## 2019-09-08 ENCOUNTER — Other Ambulatory Visit: Payer: Self-pay

## 2019-09-08 VITALS — BP 102/74 | HR 78 | Wt 98.6 lb

## 2019-09-08 DIAGNOSIS — L209 Atopic dermatitis, unspecified: Secondary | ICD-10-CM

## 2019-09-08 DIAGNOSIS — T7491XA Unspecified adult maltreatment, confirmed, initial encounter: Secondary | ICD-10-CM

## 2019-09-08 DIAGNOSIS — R21 Rash and other nonspecific skin eruption: Secondary | ICD-10-CM | POA: Diagnosis present

## 2019-09-08 MED ORDER — CETIRIZINE HCL 10 MG PO TABS: 10.0000 mg | ORAL_TABLET | Freq: Every day | ORAL | 11 refills | Status: DC

## 2019-09-08 MED ORDER — PANTOPRAZOLE SODIUM 40 MG PO TBEC: 40.0000 mg | DELAYED_RELEASE_TABLET | Freq: Every day | ORAL | 3 refills | Status: DC

## 2019-09-08 NOTE — Patient Instructions (Addendum)
[place],  It was lovely to meet your and your grandma today! I am sorry that it has been causing you so much distress. It may be an allergic reaction. I am referring you to an allergen specialist and prescribing you cetirizine which will help with your itching. Please contact the resources below for therapy.   Best wishes,  Dr Allena Katz    Therapy and Counseling Resources Most providers on this list will take Medicaid. Patients with commercial insurance or Medicare should contact their insurance company to get a list of in network providers.  [facility] [address]  [place], [place] [address]       [phone]     [facility] [address], [place], [place]  [phone]    [facility] [address] [place], [place] [phone]  [facility]:  [address] [place] [phone]  [facility]:  [address] [place], [place] [phone]  [facility] (under & uninsured) [address]   [place] [phone]    [email or web address]    [facility] -[address]     Phone:  [phone]     [facility]-  [address]  [phone]   [facility] [address]      [place], [place] [phone]  [facility]: [address] [place], [place], [place]  [phone]   [facility] (Spanish therapist) [address] [place]  [address]   [place] [address]    [phone]    [facility]   [address],  [place], [place]  [phone]   [facility]  [address] [place]  [phone]  [facility]  [address] [place], [place]        [phone]  [facility] under & uninsured [place],  [address] [place] [phone]):  Mon - Fri from 8 AM - 3 PM  [facility] [address],  (Spanish)   [address], [place]: [phone]) 8:30 - 12; 1 - 2:30  [facility] [employer],  [address] [place], [place]    [phone]):8:30 - 12; 2 - 3PM  [facility] [employer],  [address]  [place]; [phone]):   Mon - Fri 8 AM - 5 PM  [facility] [address] [place]  MWF 12:30 to 3:00 or call to schedule an appointment  [phone]  Specific Provider options Psychology Today  [email or web address]/us 1. click on find a therapist  2. enter your zip code 3. left side and select or tailor a therapist for your specific need.   [facility] Provider Directory [email or web address]  (Medicaid)   Follow all drop down to find a provider  Social Support program [facility] [place] [phone] or PhotoS[employer]pl [address], [place], [place] Recovery support and educational   In home counseling [facility] Telephone: [phone]  office in [place] [email or web address]   Does not take reg. Medicaid or Medicare private insurance [facility], [place] health Choice, [facility], [place], [place], [place], [place] Health Choice  24- Hour Availability:  . [address] [facility]   [phone] or [phone]  . [facility] [phone]  [facility] Crisis Service  [phone]   . [facility]  [employer]  [phone] (after hours)  . Therapeutic Alternative/Mobile Crisis   [phone]  . [place] National Suicide Hotline  516-120-7156 ([place])  . Call 911 or go to emergency room  . [employer]  [phone]);  [place] and [employer]   . [facility]  [phone]); [place], [place], [place], [place], [place], [place], [place]

## 2019-09-08 NOTE — Telephone Encounter (Signed)
Called and left message with APS regarding concerns. Ruben Starr, MD  Family Medicine Teaching Service
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
## 2019-09-11 DIAGNOSIS — L209 Atopic dermatitis, unspecified: Secondary | ICD-10-CM | POA: Insufficient documentation

## 2019-09-11 DIAGNOSIS — T7491XA Unspecified adult maltreatment, confirmed, initial encounter: Secondary | ICD-10-CM | POA: Insufficient documentation

## 2019-09-11 HISTORY — DX: Atopic dermatitis, unspecified: L20.9

## 2019-09-11 HISTORY — DX: Unspecified adult maltreatment, confirmed, initial encounter: T74.91XA

## 2019-09-11 NOTE — Assessment & Plan Note (Signed)
Considered atopic dermatitis as top differential due to pruritic nature and maculopapular appearance. Unlikely to be infective cause has pt has not has systemic symptoms or fevers, denies recent travel/bites. -Prescribed Cetirizine -Referral to Allergy specialist -Recommended that pt avoids cleaners, shower products containing fragrance. -Moisturize affected skin with emollients at least twice a day
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
## 2019-09-14 ENCOUNTER — Telehealth: Payer: Self-pay | Admitting: Family Medicine

## 2019-09-14 NOTE — Telephone Encounter (Signed)
Contacted pt's cell and left VM for crises number and family justice centre.

## 2019-09-16 ENCOUNTER — Ambulatory Visit (INDEPENDENT_AMBULATORY_CARE_PROVIDER_SITE_OTHER): Payer: Medicaid Other | Admitting: Allergy and Immunology

## 2019-09-16 ENCOUNTER — Other Ambulatory Visit: Payer: Self-pay

## 2019-09-16 ENCOUNTER — Encounter: Payer: Self-pay | Admitting: Allergy and Immunology

## 2019-09-16 VITALS — BP 98/50 | HR 80 | Temp 98.2°F | Resp 16 | Ht 62.0 in | Wt 101.4 lb

## 2019-09-16 DIAGNOSIS — R21 Rash and other nonspecific skin eruption: Secondary | ICD-10-CM

## 2019-09-16 DIAGNOSIS — L2089 Other atopic dermatitis: Secondary | ICD-10-CM | POA: Diagnosis not present

## 2019-09-16 HISTORY — DX: Rash and other nonspecific skin eruption: R21

## 2019-09-16 MED ORDER — LEVOCETIRIZINE DIHYDROCHLORIDE 5 MG PO TABS: ORAL_TABLET | ORAL | 5 refills | Status: DC

## 2019-09-16 MED ORDER — AQUAPHOR ADVANCED THERAPY EX OINT: TOPICAL_OINTMENT | CUTANEOUS | 1 refills | Status: DC

## 2019-09-16 MED ORDER — TRIAMCINOLONE ACETONIDE 0.1 % EX OINT: TOPICAL_OINTMENT | CUTANEOUS | 1 refills | Status: DC

## 2019-09-16 NOTE — Progress Notes (Signed)
New Patient Note  RE: Ruben Mack MRN: [id number] DOB: 08/21/1995 Date of Office Visit: 09/16/2019  Referring provider: Marthenia Rolling, DO Primary care provider: Towanda Octave, MD  Chief Complaint: Rash  History of present illness: Ruben Mack is a 24 y.o. male with mental disability seen today in consultation requested by Marthenia Rolling, DO.  He is accompanied today by his mother who provides the history.  Approximately 4 months ago he developed a rash on his back, and the rash has persisted and has progressed onto his chest as well.  The rash is pruritic.  The patient's mother is unable to describe the rash but does not believe that there have been vesicles or urticarial lesions.  The rash started while he was at a group home, however has persisted after being brought home to live with his mother.  No specific medication, food, skin care product, detergent, soap, or other environmental triggers have been identified.  The patient's mother has put over-the-counter moisturizing lotion on the rash without perceived benefit.  He took Zyrtec for 2 days last week with moderate relief.  Assessment and plan: Dermatitis Unclear etiology.  This may represent atypical eczematous dermatitis.  All environmental and food allergen skin tests were negative today despite a positive histamine control.  Appropriate skin care recommendations have been provided verbally and in written form.  A prescription has been provided for triamcinolone 0.1% ointment sparingly to affected areas twice daily as needed below the face and neck. Care is to be taken to avoid the axillae and groin area.  A prescription has been provided for levocetirizine(Xyzal), 5 mg daily as needed.  The patient's mother has been asked to make note of any foods that trigger symptom flares.  Fingernails are to be kept trimmed.  Information has been provided regarding CLn BodyWash to reduce staph aureus colonization.  CLn BodyWash is  ordered online.  If this problem persists or progresses despite treatment plan as outlined above, further evaluation by a dermatologist may be helpful.   Meds ordered this encounter  Medications  . levocetirizine (XYZAL) 5 MG tablet    Sig: Take 1 tablet daily as needed for runny nose or itching.    Dispense:  34 tablet    Refill:  5  . triamcinolone ointment (KENALOG) 0.1 %    Sig: Apply twice daily to red itchy areas below face.    Dispense:  453.6 g    Refill:  1    Mix with Aquaphor 1:1  . Emollient (AQUAPHOR ADVANCED THERAPY) OINT    Sig: Apply twice daily to red itchy areas below face.    Dispense:  396 g    Refill:  1    Mix 1:1 with triamcinolone 0.1% ointment    Diagnostics: Environmental skin testing: Negative despite a positive histamine control. Food allergen skin testing: Negative despite a positive histamine control.    Physical examination: Blood pressure (!) 98/50, pulse 80, temperature 98.2 F (36.8 C), temperature source Oral, resp. rate 16, height 5\' 2"  (1.575 m), weight 101 lb 6.4 oz (46 kg), SpO2 100 %.  General: Alert, interactive, in no acute distress. Neck: Supple without lymphadenopathy. Lungs: Clear to auscultation without wheezing, rhonchi or rales. CV: Normal S1, S2 without murmurs. Abdomen: Nondistended, nontender. Skin: Warm and dry, without lesions or rashes. Extremities:  No clubbing, cyanosis or edema. Neuro:   Grossly intact.  Review of systems:  Review of systems negative except as noted in HPI / PMHx or noted  below: Review of Systems  Constitutional: Negative.   HENT: Negative.   Eyes: Negative.   Respiratory: Negative.   Cardiovascular: Negative.   Gastrointestinal: Negative.   Genitourinary: Negative.   Musculoskeletal: Negative.   Skin: Negative.   Neurological: Negative.   Endo/Heme/Allergies: Negative.   Psychiatric/Behavioral: Negative.     Past medical history:  Past Medical History:  Diagnosis Date  . 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 Cristy Folks ([facility])  . Mental disability    intellectual  . Shortness of breath dyspnea    uses inhaler    Past surgical history:  Past Surgical History:  Procedure Laterality Date  . CLEFT PALATE REPAIR    . MOUTH SURGERY     palate adjustment  . TOOTH EXTRACTION N/A 03/03/2015   Procedure: WISDOM EXTRACTIONS;  Surgeon: Ocie Doyne, DDS;  [facility];  Service: Oral Surgery;  Laterality: N/A;    Family history: Family History  Problem Relation Age of Onset  . Hyperlipidemia Mother   . Thyroid disease Mother   . Diabetes Mother   . Food Allergy Mother   . Urticaria Mother   . Cancer Other   . Allergic rhinitis Neg Hx   . Angioedema Neg Hx   . Asthma Neg Hx   . Eczema Neg Hx   . Immunodeficiency Neg Hx     Social history: Social History   Socioeconomic History  . Marital status: Single    Spouse name: Not on file  . Number of children: Not on file  . Years of education: Not on file  . Highest education level: Not on file  Occupational History  . Not on file  Tobacco Use  . Smoking status: Never Smoker  . Smokeless tobacco: Never Used  Substance and Sexual Activity  . Alcohol use: No  . Drug use: No  . Sexual activity: Not on file  Other Topics Concern  . Not on file  Social History Narrative  . Not on file   Social Determinants of Health   Financial Resource Strain:   . Difficulty of Paying Living Expenses: Not on file  Food Insecurity:   . Worried About Programme researcher, broadcasting/film/video in the Last Year: Not on file  . Ran Out of Food in the Last Year: Not on file  Transportation Needs:   . Lack of Transportation (Medical): Not on file  . Lack of Transportation (Non-Medical): Not on file  Physical Activity:   . Days of Exercise per Week: Not on file  . Minutes of Exercise per Session: Not on file  Stress:   . Feeling of Stress : Not on  file  Social Connections:   . Frequency of Communication with Friends and Family: Not on file  . Frequency of Social Gatherings with Friends and Family: Not on file  . Attends Religious Services: Not on file  . Active Member of Clubs or Organizations: Not on file  . Attends Banker Meetings: Not on file  . Marital Status: Not on file  Intimate Partner Violence:   . Fear of Current or Ex-Partner: Not on file  . Emotionally Abused: Not on file  . Physically Abused: Not on file  . Sexually Abused: Not on file    Environmental History: The patient lives in an apartment with carpeting throughout and central air/heat.  There is no known mold/water damage in the home.  There are  no pets in the home.  He is a non-smoker and is not exposed to secondhand cigarette smoke in the house or car.   Current Outpatient Medications  Medication Sig Dispense Refill  . cetirizine (ZYRTEC) 10 MG tablet Take 1 tablet (10 mg total) by mouth daily. 30 tablet 11  . pantoprazole (PROTONIX) 40 MG tablet Take 1 tablet (40 mg total) by mouth daily. 30 tablet 0  . Emollient (AQUAPHOR ADVANCED THERAPY) OINT Apply twice daily to red itchy areas below face. 396 g 1  . famotidine (PEPCID) 20 MG tablet Take 1 tablet (20 mg total) by mouth 2 (two) times daily. (Patient not taking: Reported on 09/16/2019) 30 tablet 0  . levocetirizine (XYZAL) 5 MG tablet Take 1 tablet daily as needed for runny nose or itching. 34 tablet 5  . pantoprazole (PROTONIX) 40 MG tablet Take 1 tablet (40 mg total) by mouth daily. (Patient not taking: Reported on 09/16/2019) 30 tablet 3  . triamcinolone ointment (KENALOG) 0.1 % Apply twice daily to red itchy areas below face. 453.6 g 1   No current facility-administered medications for this visit.    Known medication allergies: Allergies  Allergen Reactions  . Poison Ivy Extract Rash and Other (See Comments)    Welts and sneezing, also    I appreciate the opportunity to take part in  Woodrow's care. Please do not hesitate to contact me with questions.  Sincerely,   R. Edgar Frisk, MD

## 2019-09-16 NOTE — Assessment & Plan Note (Signed)
Unclear etiology.  This may represent atypical eczematous dermatitis.  All environmental and food allergen skin tests were negative today despite a positive histamine control.  Appropriate skin care recommendations have been provided verbally and in written form.  A prescription has been provided for triamcinolone 0.1% ointment sparingly to affected areas twice daily as needed below the face and neck. Care is to be taken to avoid the axillae and groin area.  A prescription has been provided for levocetirizine(Xyzal), 5 mg daily as needed.  The patient's mother has been asked to make note of any foods that trigger symptom flares.  Fingernails are to be kept trimmed.  Information has been provided regarding CLn BodyWash to reduce staph aureus colonization.  CLn BodyWash is ordered online.  If this problem persists or progresses despite treatment plan as outlined above, further evaluation by a dermatologist may be helpful.

## 2019-09-16 NOTE — Patient Instructions (Addendum)
Dermatitis Unclear etiology.  This may represent atypical eczematous dermatitis.  All environmental and food allergen skin tests were negative today despite a positive histamine control.  Appropriate skin care recommendations have been provided verbally and in written form.  A prescription has been provided for triamcinolone 0.1% ointment sparingly to affected areas twice daily as needed below the face and neck. Care is to be taken to avoid the axillae and groin area.  A prescription has been provided for levocetirizine(Xyzal), 5 mg daily as needed.  The patient's mother has been asked to make note of any foods that trigger symptom flares.  Fingernails are to be kept trimmed.  Information has been provided regarding CLn BodyWash to reduce staph aureus colonization.  CLn BodyWash is ordered online.  If this problem persists or progresses despite treatment plan as outlined above, further evaluation by a dermatologist may be helpful.   ECZEMA SKIN CARE REGIMEN:  Bathe and soak for 10 minutes in warm water once today. Pat dry.  Immediately apply the below emollients: To healthy skin apply Aquaphor or Vaseline jelly twice a day. To affected areas on the body (below the face and neck), apply: . Triamcinolone 0.1 % ointment twice a day as needed. . With ointments be careful to avoid the armpits and groin area. Note of any foods make the eczema worse. Keep finger nails trimmed and filed.  CLn BodyWash may be ordered online at [email or web address]

## 2019-11-09 ENCOUNTER — Telehealth: Payer: Self-pay | Admitting: Family Medicine

## 2019-11-09 NOTE — Telephone Encounter (Signed)
Pt's mother is dropping off paper work to be filled out by the doctor for dental work.

## 2019-11-09 NOTE — Telephone Encounter (Signed)
Clinical info completed on Dentistry form.  Place form in Dr. Eliane Decree box for completion.  Ruben Mack, CMA

## 2019-11-12 NOTE — Telephone Encounter (Signed)
Patient mother called and informed form was completed and ready for pick up.   Will fax copy to number provided at [phone].   Copy placed in batch scanning.   Veronda Prude, RN

## 2019-12-01 ENCOUNTER — Ambulatory Visit: Payer: Medicaid Other | Admitting: Cardiology

## 2019-12-01 NOTE — Progress Notes (Deleted)
Cardiology Office Note:    Date:  12/01/2019   ID:  Ruben Mack, DOB 17-Feb-1996, MRN [id number]  PCP:  Lattie Haw, MD  Cardiologist:  Shirlee More, MD    Referring MD: Lattie Haw, MD    ASSESSMENT:    No diagnosis found. PLAN:    In order of problems listed above:  1. ***   Next appointment: ***   Medication Adjustments/Labs and Tests Ordered: Current medicines are reviewed at length with the patient today.  Concerns regarding medicines are outlined above.  No orders of the defined types were placed in this encounter.  No orders of the defined types were placed in this encounter.   No chief complaint on file.   History of Present Illness:    Ruben Mack is a 24 y.o. 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  He had previously been followed at [facility] pediatric cardiology.  Echocardiogram April 2020 showed normal biventricular systolic function and trabeculated left ventricle with findings of noncompaction.  He underwent a cardiac MR 06/16/2019 confirming the findings of noncompaction cardiomyopathy with normal left ventricular systolic function normal right ventricular size and function no evidence of thrombus.  He wore a ZIO monitor for 7 days to screen for ventricular tachycardia it was normal.  He subsequently had another episode of chest pain was seen in the emergency room [facility] 08/13/2019.  At that visit to high-sensitivity troponins were normal and his EKG showed no changes compared to our office tracing. Compliance with diet, lifestyle and medications: ***   3 mo ago  (08/14/19) 7 mo ago  (05/05/19) 7 mo ago  (05/05/19) 7 mo ago  (04/13/19)   Troponin I (High Sensitivity) <18 ng/L <2  4 CM  4 CM  5 CM     Ref Range & Units 3 mo ago  D-Dimer, Quant 0.00 - 0.50 ug/mL-FEU <0.27    EKG 08/13/2019 independently reviewed sinus rhythm rightward axis otherwise normal. Past Medical History:  Diagnosis Date  .  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 Kathie Rhodes ([place])  . Mental disability    intellectual  . Shortness of breath dyspnea    uses inhaler    Past Surgical History:  Procedure Laterality Date  . CLEFT PALATE REPAIR    . MOUTH SURGERY     palate adjustment  . TOOTH EXTRACTION N/A 03/03/2015   Procedure: WISDOM EXTRACTIONS;  Surgeon: Diona Browner, DDS;  Location: [place];  Service: Oral Surgery;  Laterality: N/A;    Current Medications: No outpatient medications have been marked as taking for the 12/01/19 encounter (Appointment) with Richardo Priest, MD.     Allergies:   Poison ivy extract   Social History   Socioeconomic History  . Marital status: Single    Spouse name: Not on file  . Number of children: Not on file  . Years of education: Not on file  . Highest education level: Not on file  Occupational History  . Not on file  Tobacco Use  . Smoking status: Never Smoker  . Smokeless tobacco: Never Used  Substance and Sexual Activity  . Alcohol use: No  . Drug use: No  . Sexual activity: Not on file  Other Topics Concern  . Not on file  Social History Narrative  . Not on file   Social Determinants of Health   Financial Resource Strain:   .  Difficulty of Paying Living Expenses:   Food Insecurity:   . Worried About Programme researcher, broadcasting/film/video in the Last Year:   . Barista in the Last Year:   Transportation Needs:   . Freight forwarder (Medical):   Marland Kitchen Lack of Transportation (Non-Medical):   Physical Activity:   . Days of Exercise per Week:   . Minutes of Exercise per Session:   Stress:   . Feeling of Stress :   Social Connections:   . Frequency of Communication with Friends and Family:   . Frequency of Social Gatherings with Friends and Family:   . Attends Religious Services:   . Active Member of Clubs or Organizations:   . Attends Banker Meetings:    Marland Kitchen Marital Status:      Family History: The patient's ***family history includes Cancer in an other family member; Diabetes in his mother; Food Allergy in his mother; Hyperlipidemia in his mother; Thyroid disease in his mother; Urticaria in his mother. There is no history of Allergic rhinitis, Angioedema, Asthma, Eczema, or Immunodeficiency. ROS:   Please see the history of present illness.    All other systems reviewed and are negative.  EKGs/Labs/Other Studies Reviewed:    The following studies were reviewed today:  EKG:  EKG ordered today and personally reviewed.  The ekg ordered today demonstrates ***  Recent Labs: 12/22/2018: ALT 16 08/14/2019: BUN <5; Creatinine, Ser 0.74; Hemoglobin 15.0; Platelets 299; Potassium 3.9; Sodium 138  Recent Lipid Panel No results found for: CHOL, TRIG, HDL, CHOLHDL, VLDL, LDLCALC, LDLDIRECT  Physical Exam:    VS:  There were no vitals taken for this visit.    Wt Readings from Last 3 Encounters:  09/16/19 101 lb 6.4 oz (46 kg)  09/08/19 98 lb 9.6 oz (44.7 kg)  06/30/19 102 lb (46.3 kg)     GEN: *** Well nourished, well developed in no acute distress HEENT: Normal NECK: No JVD; No carotid bruits LYMPHATICS: No lymphadenopathy CARDIAC: ***RRR, no murmurs, rubs, gallops RESPIRATORY:  Clear to auscultation without rales, wheezing or rhonchi  ABDOMEN: Soft, non-tender, non-distended MUSCULOSKELETAL:  No edema; No deformity  SKIN: Warm and dry NEUROLOGIC:  Alert and oriented x 3 PSYCHIATRIC:  Normal affect    Signed, Norman Herrlich, MD  12/01/2019 7:49 AM    [facility]

## 2019-12-11 ENCOUNTER — Encounter: Payer: Self-pay | Admitting: Cardiology

## 2020-01-25 ENCOUNTER — Ambulatory Visit: Payer: Medicaid Other | Admitting: Cardiology

## 2020-01-26 ENCOUNTER — Ambulatory Visit: Payer: Medicaid Other | Admitting: Cardiology

## 2020-01-26 NOTE — Progress Notes (Deleted)
Cardiology Office Note:    Date:  01/26/2020   ID:  Ruben Mack, DOB 1996-01-08, MRN [id number]  PCP:  Lattie Haw, MD  Cardiologist:  Shirlee More, MD    Referring MD: Lattie Haw, MD    ASSESSMENT:    No diagnosis found. PLAN:    In order of problems listed above:  1. ***   Next appointment: ***   Medication Adjustments/Labs and Tests Ordered: Current medicines are reviewed at length with the patient today.  Concerns regarding medicines are outlined above.  No orders of the defined types were placed in this encounter.  No orders of the defined types were placed in this encounter.   No chief complaint on file.   History of Present Illness:    Ruben Mack is a 24 y.o. 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 Compliance with diet, lifestyle and medications: ***  Cardiac MRI 06/16/2019 with LV hyper trabeculation with normal LV systolic function which met criteria for noncompaction cardiomyopathy in the setting of family history. RV normal size and function with inferior RV slight late gadolinium enhancement. No additional congenital heart defects noted, no LV thrombus.  Event monitor was utilized for 7 days assess palpitation beginning 06/18/2019 which showed no significant arrhythmia in particular he had no episodes of ventricular tachycardia.  He had 14 triggered events 1 of which was associated with a single atrial premature contraction and all the others sinus rhythm Past Medical History:  Diagnosis Date  . 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 Kathie Rhodes ([place])  . Mental disability    intellectual  . Shortness of breath dyspnea    uses inhaler    Past Surgical History:  Procedure Laterality Date  . CLEFT PALATE REPAIR    . MOUTH SURGERY     palate adjustment  . TOOTH EXTRACTION N/A 03/03/2015   Procedure: WISDOM EXTRACTIONS;   Surgeon: Diona Browner, DDS;  Location: [place];  Service: Oral Surgery;  Laterality: N/A;    Current Medications: No outpatient medications have been marked as taking for the 01/26/20 encounter (Appointment) with Richardo Priest, MD.     Allergies:   Poison ivy extract   Social History   Socioeconomic History  . Marital status: Single    Spouse name: Not on file  . Number of children: Not on file  . Years of education: Not on file  . Highest education level: Not on file  Occupational History  . Not on file  Tobacco Use  . Smoking status: Never Smoker  . Smokeless tobacco: Never Used  Substance and Sexual Activity  . Alcohol use: No  . Drug use: No  . Sexual activity: Not on file  Other Topics Concern  . Not on file  Social History Narrative  . Not on file   Social Determinants of Health   Financial Resource Strain:   . Difficulty of Paying Living Expenses:   Food Insecurity:   . Worried About Charity fundraiser in the Last Year:   . Arboriculturist in the Last Year:   Transportation Needs:   . Film/video editor (Medical):   Marland Kitchen Lack of Transportation (Non-Medical):   Physical Activity:   . Days of Exercise per Week:   . Minutes of Exercise per Session:   Stress:   . Feeling of Stress :   Social Connections:   .  Frequency of Communication with Friends and Family:   . Frequency of Social Gatherings with Friends and Family:   . Attends Religious Services:   . Active Member of Clubs or Organizations:   . Attends Archivist Meetings:   Marland Kitchen Marital Status:      Family History: The patient's ***family history includes Cancer in an other family member; Diabetes in his mother; Food Allergy in his mother; Hyperlipidemia in his mother; Thyroid disease in his mother; Urticaria in his mother. There is no history of Allergic rhinitis, Angioedema, Asthma, Eczema, or Immunodeficiency. ROS:   Please see the history of present illness.    All other systems reviewed  and are negative.  EKGs/Labs/Other Studies Reviewed:    The following studies were reviewed today:  EKG:  EKG ordered today and personally reviewed.  The ekg ordered today demonstrates ***  Recent Labs: 08/14/2019: BUN <5; Creatinine, Ser 0.74; Hemoglobin 15.0; Platelets 299; Potassium 3.9; Sodium 138  Recent Lipid Panel No results found for: CHOL, TRIG, HDL, CHOLHDL, VLDL, LDLCALC, LDLDIRECT  Physical Exam:    VS:  There were no vitals taken for this visit.    Wt Readings from Last 3 Encounters:  09/16/19 101 lb 6.4 oz (46 kg)  09/08/19 98 lb 9.6 oz (44.7 kg)  06/30/19 102 lb (46.3 kg)     GEN: *** Well nourished, well developed in no acute distress HEENT: Normal NECK: No JVD; No carotid bruits LYMPHATICS: No lymphadenopathy CARDIAC: ***RRR, no murmurs, rubs, gallops RESPIRATORY:  Clear to auscultation without rales, wheezing or rhonchi  ABDOMEN: Soft, non-tender, non-distended MUSCULOSKELETAL:  No edema; No deformity  SKIN: Warm and dry NEUROLOGIC:  Alert and oriented x 3 PSYCHIATRIC:  Normal affect    Signed, Shirlee More, MD  01/26/2020 8:06 AM    [place]

## 2020-04-08 IMAGING — DX CHEST - 2 VIEW
2 series · 2 of 2 positions shown · non-contrast
Comparison: 11/20/2018

CLINICAL DATA: Chest pain

EXAM:
CHEST - 2 VIEW

[chest pa]
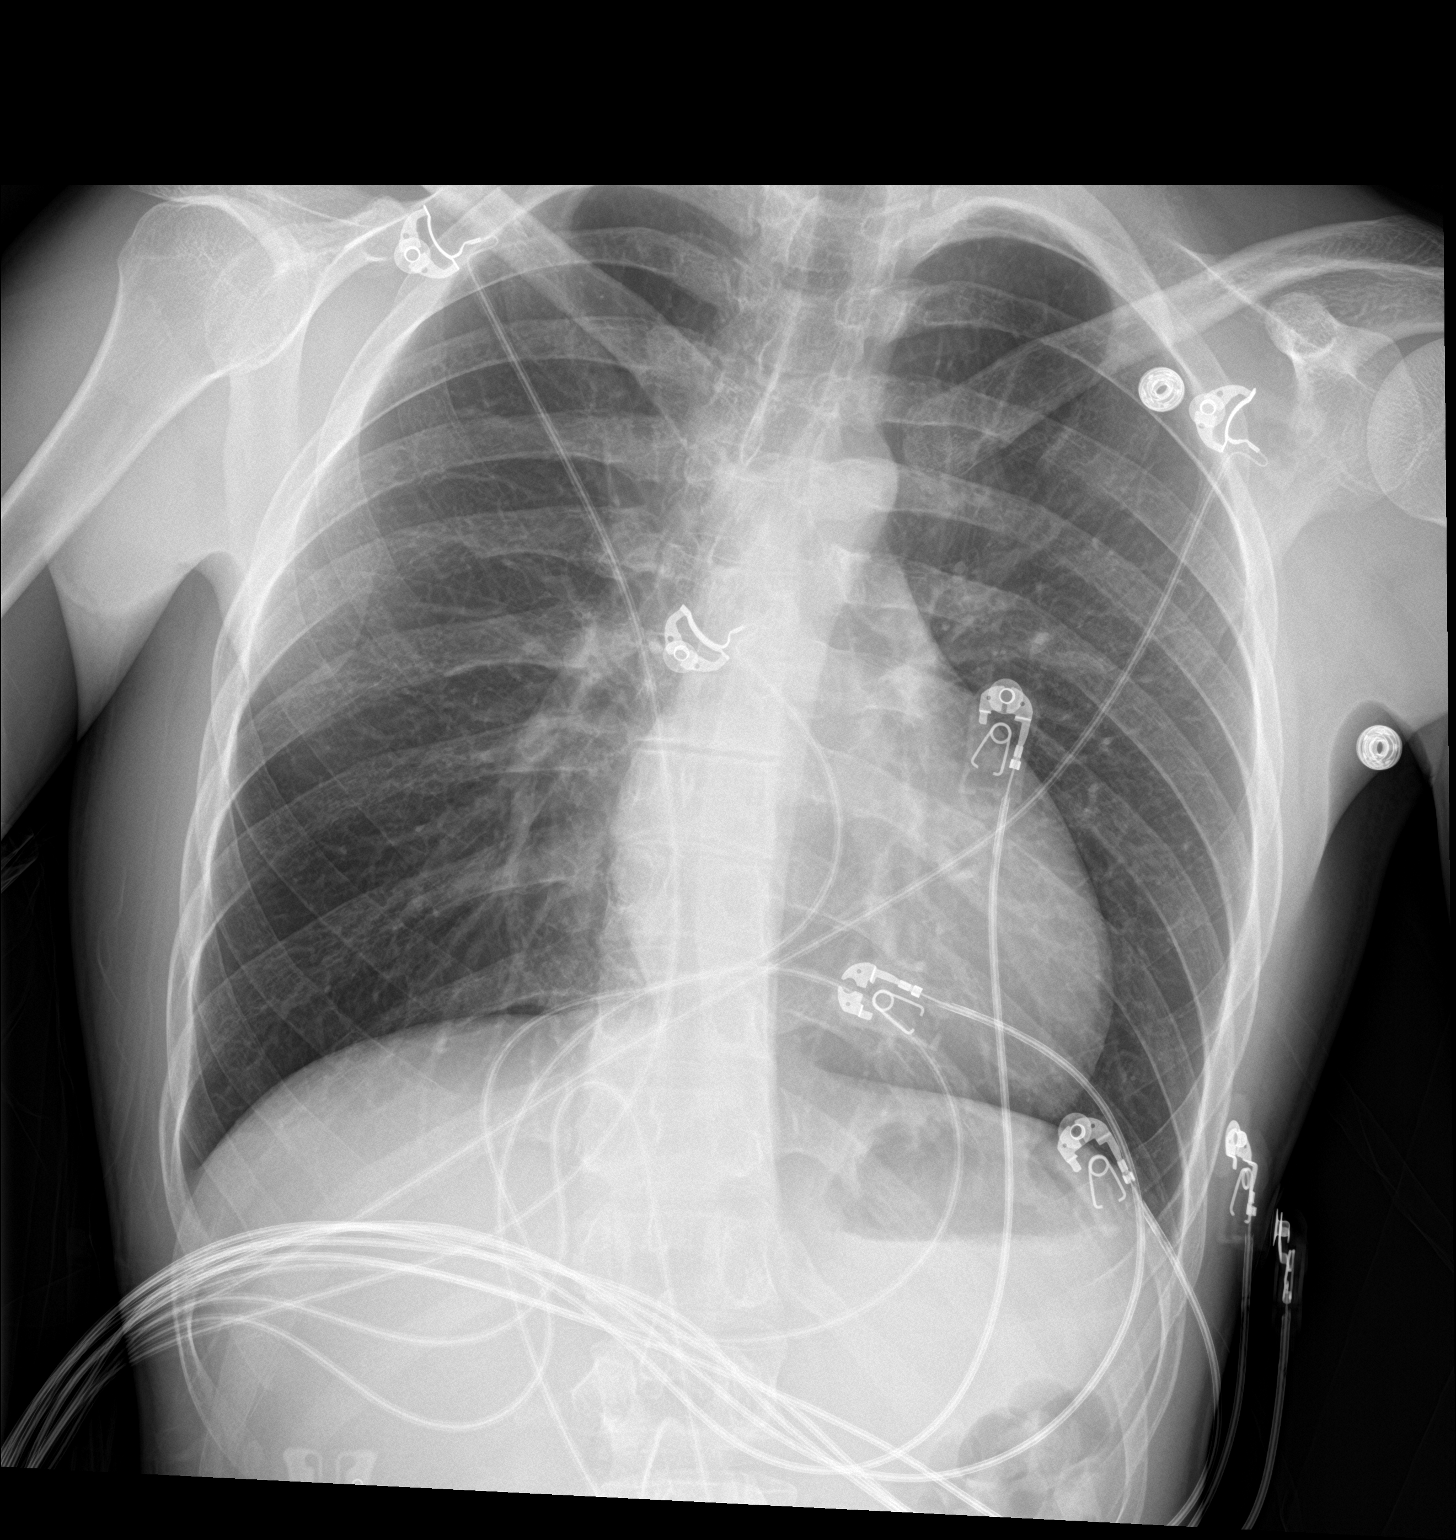

[chest lat]
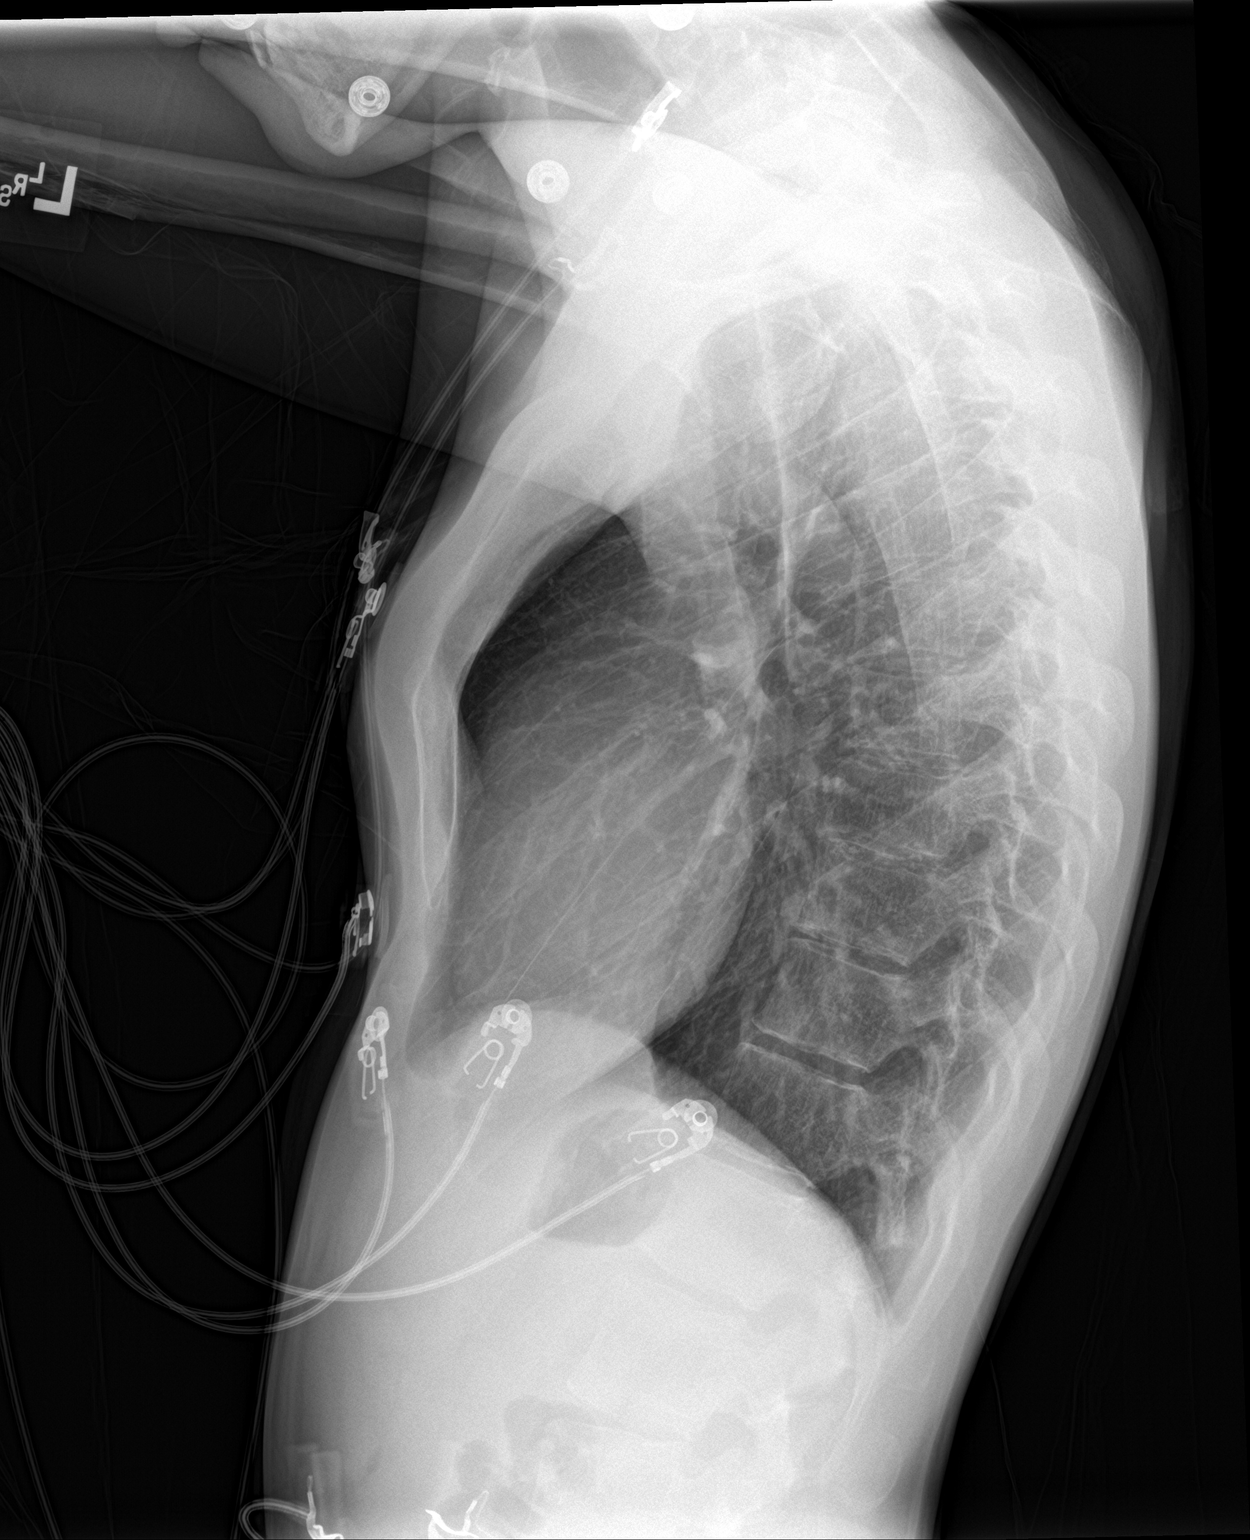

[2 of 2 positions shown; findings below may reference images not displayed]

FINDINGS: Normal heart size and mediastinal contours. No acute infiltrate or
edema. No effusion or pneumothorax. No acute osseous findings.
IMPRESSION: Negative chest.

## 2020-09-18 IMAGING — MR MR CARD MORPHOLOGY WO/W CM
41 of 46 series · 41 of 46 positions shown · IV contrast (gadavist)
Comparison: none

CLINICAL DATA: 23M evaluate for LV noncompaction

EXAM:
CARDIAC MRI
TECHNIQUE: The patient was scanned on a 1.5 Tesla Siemens magnet. A dedicated
cardiac coil was used. Functional imaging was done using Fiesta
sequences. [DATE], and 4 chamber views were done to assess for RWMA's.
Modified Ologo rule using a short axis stack was used to
calculate an ejection fraction on a dedicated work station using
Circle software. The patient received 7 cc of Gadavist. After 10
minutes inversion recovery sequences were used to assess for
infiltration and scar tissue.
CONTRAST:  7 cc  of Gadavist

[Series 11: bSSFP · oblique · 8.0mm · 1.43mm/px · 1 of 25 slices shown (1 of 31)]
[im 1/25]
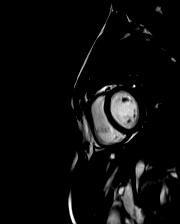

[Series 11: bSSFP · oblique · 8.0mm · 1.43mm/px · 1 of 25 slices shown (2 of 31)]
[im 1/25]
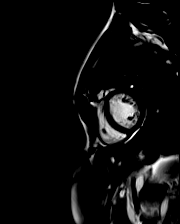

[Series 11: bSSFP · oblique · 8.0mm · 1.43mm/px · 1 of 25 slices shown (3 of 31)]
[im 1/25]
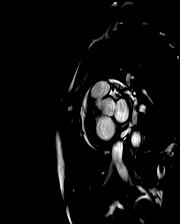

[Series 11: bSSFP · oblique · 8.0mm · 1.43mm/px · 1 of 25 slices shown (4 of 31)]
[im 1/25]
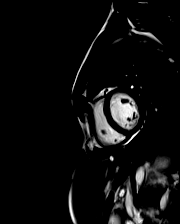

[Series 11: bSSFP · oblique · 8.0mm · 1.43mm/px · 1 of 25 slices shown (5 of 31)]
[im 1/25]
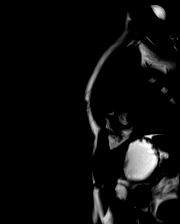

[Series 11: bSSFP · oblique · 8.0mm · 1.43mm/px · 1 of 25 slices shown (6 of 31)]
[im 1/25]
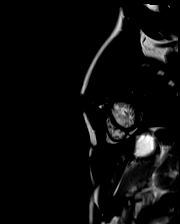

[Series 11: bSSFP · oblique · 8.0mm · 1.43mm/px · 1 of 25 slices shown (7 of 31)]
[im 1/25]
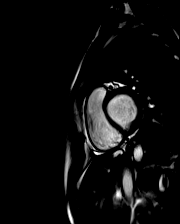

[Series 11: bSSFP · oblique · 8.0mm · 1.43mm/px · 1 of 25 slices shown (8 of 31)]
[im 1/25]
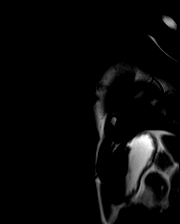

[Series 11: bSSFP · oblique · 8.0mm · 1.43mm/px · 1 of 25 slices shown (9 of 31)]
[im 1/25]
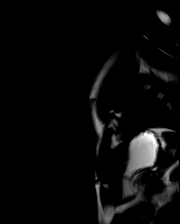

[Series 11: bSSFP · oblique · 8.0mm · 1.43mm/px · 1 of 25 slices shown (10 of 31)]
[im 1/25]
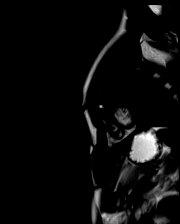

[Series 11: bSSFP · oblique · 8.0mm · 1.43mm/px · 1 of 25 slices shown (11 of 31)]
[im 1/25]
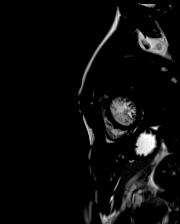

[Series 11: bSSFP · oblique · 8.0mm · 1.43mm/px · 1 of 25 slices shown (12 of 31)]
[im 1/25]
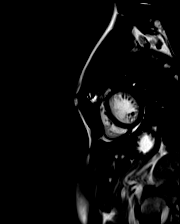

[Series 11: bSSFP · oblique · 8.0mm · 1.43mm/px · 1 of 25 slices shown (13 of 31)]
[im 1/25]
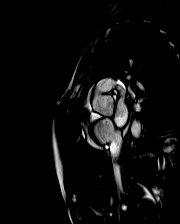

[Series 11: bSSFP · oblique · 8.0mm · 1.43mm/px · 1 of 25 slices shown (14 of 31)]
[im 1/25]
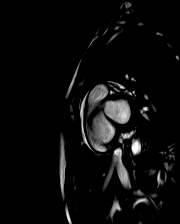

[Series 11: bSSFP · oblique · 8.0mm · 1.43mm/px · 1 of 25 slices shown (15 of 31)]
[im 1/25]
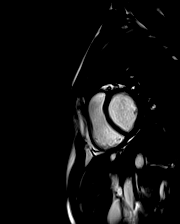

[Series 12: bSSFP · oblique · 6.0mm · 1.25mm/px · 1 of 25 slices shown (16 of 31)]
[im 1/25]
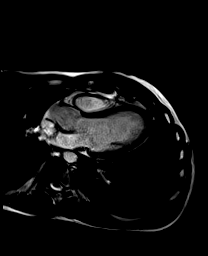

[Series 13: bSSFP · coronal · 6.0mm · 1.25mm/px · 1 of 25 slices shown (17 of 31)]
[im 1/25]
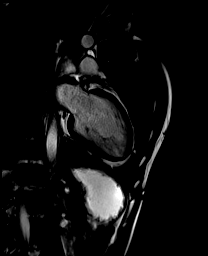

[Series 14: bSSFP · oblique · 6.0mm · 1.25mm/px · 1 of 25 slices shown (18 of 31)]
[im 1/25]
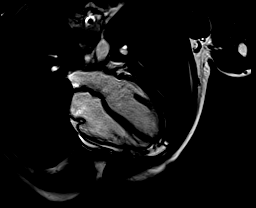

[Series 15: t1_tse_db axial · axial · 6.0mm · 1.18mm/px · 1 of 13 slices shown]
[im 1/13]
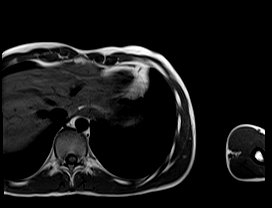

[Series 16: t2_stir_db axial · axial · 6.0mm · 1.35mm/px · 1 of 11 slices shown (1 of 2)]
[im 1/11]
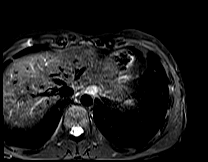

[Series 17: t1_tse_db sag · sagittal · 5.0mm · 1.10mm/px · 1 of 16 slices shown]
[im 1/16]
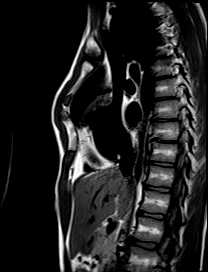

[Series 18: t2_stir_db axial · axial · 6.0mm · 1.35mm/px · 1 of 5 slices shown (2 of 2)]
[im 1/5]
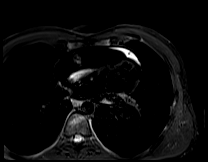

[Series 19: bSSFP · axial · 6.0mm · 1.25mm/px · 1 of 25 slices shown (19 of 31)]
[im 1/25]
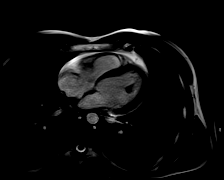

[Series 19: bSSFP · axial · 6.0mm · 1.25mm/px · 1 of 25 slices shown (20 of 31)]
[im 1/25]
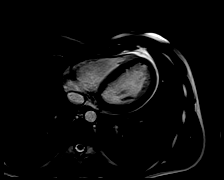

[Series 19: bSSFP · axial · 6.0mm · 1.25mm/px · 1 of 25 slices shown (21 of 31)]
[im 1/25]
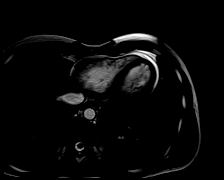

[Series 19: bSSFP · axial · 6.0mm · 1.25mm/px · 1 of 25 slices shown (22 of 31)]
[im 1/25]
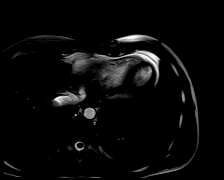

[Series 19: bSSFP · axial · 6.0mm · 1.25mm/px · 1 of 25 slices shown (23 of 31)]
[im 1/25]
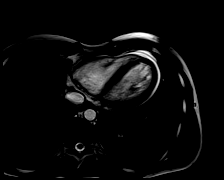

[Series 19: bSSFP · axial · 6.0mm · 1.25mm/px · 1 of 25 slices shown (24 of 31)]
[im 1/25]
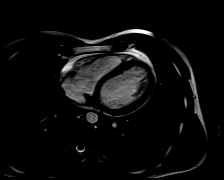

[Series 19: bSSFP · axial · 6.0mm · 1.25mm/px · 1 of 25 slices shown (25 of 31)]
[im 1/25]
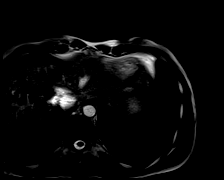

[Series 19: bSSFP · axial · 6.0mm · 1.25mm/px · 1 of 25 slices shown (26 of 31)]
[im 1/25]
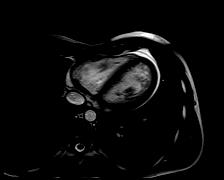

[Series 19: bSSFP · axial · 6.0mm · 1.25mm/px · 1 of 25 slices shown (27 of 31)]
[im 1/25]
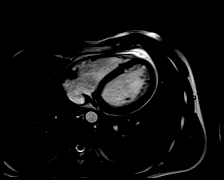

[Series 19: bSSFP · axial · 6.0mm · 1.25mm/px · 1 of 25 slices shown (28 of 31)]
[im 1/25]
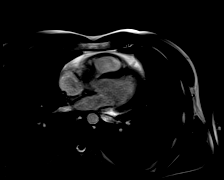

[Series 19: bSSFP · axial · 6.0mm · 1.25mm/px · 1 of 25 slices shown (29 of 31)]
[im 1/25]
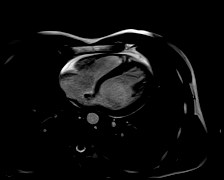

[Series 19: bSSFP · axial · 6.0mm · 1.25mm/px · 1 of 25 slices shown (30 of 31)]
[im 1/25]
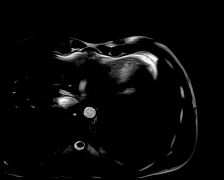

[Series 19: bSSFP · axial · 6.0mm · 1.25mm/px · 1 of 25 slices shown (31 of 31)]
[im 1/25]
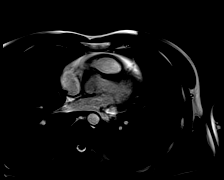

[Series 21: lge_single shot sa · oblique · 8.0mm · 1.67mm/px · 1 of 15 slices shown (1 of 2)]
[im 1/15]
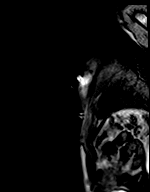

[Series 22: lge_single shot sa · oblique · 8.0mm · 1.67mm/px · 1 of 15 slices shown (2 of 2)]
[im 1/15]
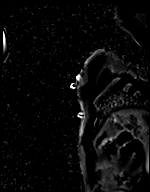

[Series 30: lge short axis_mag · oblique · 8.0mm · 1.25mm/px · 1 of 15 slices shown]
[im 1/15]
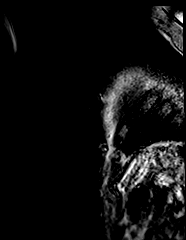

[Series 31: lge short axis_psir · oblique · 8.0mm · 1.25mm/px · 1 of 15 slices shown]
[im 1/15]
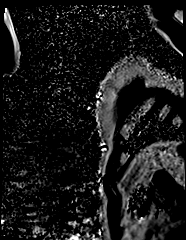

[Series 32: lge radial ((date)ch)_mag · oblique · 6.0mm · 1.18mm/px · 1 of 3 slices shown]
[im 1/3]
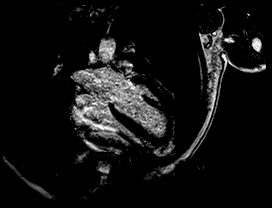

[Series 33: lge radial ((date)ch)_psir · oblique · 6.0mm · 1.18mm/px · 1 of 3 slices shown]
[im 1/3]
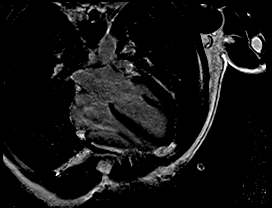

[41 of 46 positions shown; findings below may reference images not displayed]

FINDINGS: Left ventricle:

- LV hypertrabeculation with ratio trabeculated to nontrabeculated
myocardium >2.3 at end diastole in apical segments

- Normal size

- Normal wall thickness

- Normal systolic function

- Inferior RV insertion site LGE

LV EF: 64% (Normal 56-78%)

Absolute volumes:

LV EDV: 113mL (Normal 77-195 mL)

LV ESV: 40mL (Normal 19-72 mL)

LV SV: 73mL (Normal 51-133 mL)

CO: 4.9L/min (Normal 2.8-8.8 L/min)

Indexed volumes:

LV EDV: 79mL/sq-m (Normal 47-92 mL/sq-m)

LV ESV: 28mL/sq-m (Normal 13-30 mL/sq-m)

LV SV: 51mL/sq-m (Normal 32-62 mL/sq-m)

CI: 3.5L/min/sq-m (Normal 1.7-4.2 L/min/sq-m)

Right ventricle: Normal size and systolic function

RV EF:  67% (Normal 47-74%)

Absolute volumes:

RV EDV: 109mL (Normal 88-227 mL)

RV ESV: 37mL (Normal 23-103 mL)

RV SV: 73mL (Normal 52-138 mL)

CO: 4.9L/min (Normal 2.8-8.8 L/min)

Indexed volumes:

RV EDV: 77mL/sq-m (Normal 55-105 mL/sq-m)

RV ESV: 26mL/sq-m (Normal 15-43 mL/sq-m)

RV SV: 51mL/sq-m (Normal 32-64 mL/sq-m)

CI: 4.9L/min/sq-m (Normal 1.7-4.2 L/min/sq-m)

Left atrium: Normal size

Right atrium: Normal size

Mitral valve: No regurgitation

Aortic valve: No regurgitation

Tricuspid valve: No regurgitation

Pericardium: Normal
IMPRESSION: 1. LV hypertrabeculation. While meets imaging criteria for LV
noncompaction (ratio noncompacted to compacted myocardium >2.3 in
end diastole), LV systolic function is normal, so would need other
clinical manifestation (such as arrhythmia, thromboembolic event, or
family history cardiomyopathy) to be considered LV noncompaction
cardiomyopathy

2.  Normal LV size, wall thickness, and systolic function (EF 64%)

3.  Normal RV size and systolic function (EF 67%)

4. Inferior RV insertion site late gadolinium enhancement, which is
a nonspecific finding and often seen in setting of elevated
pulmonary pressures but has been described in LV noncompaction
(Hasiya et al. Eur J Heart Fail. [DATE]):170-6)

## 2020-10-12 DIAGNOSIS — F79 Unspecified intellectual disabilities: Secondary | ICD-10-CM | POA: Insufficient documentation

## 2020-10-12 DIAGNOSIS — I517 Cardiomegaly: Secondary | ICD-10-CM | POA: Insufficient documentation
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
## 2020-10-18 ENCOUNTER — Other Ambulatory Visit: Payer: Self-pay

## 2020-10-18 ENCOUNTER — Encounter: Payer: Self-pay | Admitting: Cardiology

## 2020-10-18 ENCOUNTER — Ambulatory Visit (INDEPENDENT_AMBULATORY_CARE_PROVIDER_SITE_OTHER): Payer: Medicaid Other | Admitting: Cardiology

## 2020-10-18 VITALS — BP 98/60 | HR 77 | Ht 62.0 in | Wt 98.1 lb

## 2020-10-18 DIAGNOSIS — I428 Other cardiomyopathies: Secondary | ICD-10-CM | POA: Diagnosis not present

## 2020-10-18 NOTE — Patient Instructions (Signed)
Medication Instructions:  Your physician recommends that you continue on your current medications as directed. Please refer to the Current Medication list given to you today.  *If you need a refill on your cardiac medications before your next appointment, please call your pharmacy*   Lab Work: None If you have labs (blood work) drawn today and your tests are completely normal, you will receive your results only by: Marland Kitchen MyChart Message (if you have MyChart) OR . A paper copy in the mail If you have any lab test that is abnormal or we need to change your treatment, we will call you to review the results.   Testing/Procedures: Your physician has requested that you have an echocardiogram. Echocardiography is a painless test that uses sound waves to create images of your heart. It provides your doctor with information about the size and shape of your heart and how well your heart's chambers and valves are working. This procedure takes approximately one hour. There are no restrictions for this procedure.     Follow-Up: At [facility], you and your health needs are our priority.  As part of our continuing mission to provide you with exceptional heart care, we have created designated Provider Care Teams.  These Care Teams include your primary Cardiologist (physician) and Advanced Practice Providers (APPs -  Physician Assistants and Nurse Practitioners) who all work together to provide you with the care you need, when you need it.   We recommend signing up for the patient portal called "MyChart".  Sign up information is provided on this After Visit Summary.  MyChart is used to connect with patients for Virtual Visits (Telemedicine).  Patients are able to view lab/test results, encounter notes, upcoming appointments, etc.  Non-urgent messages can be sent to your provider as well.   To learn more about what you can do with MyChart, go to [email or web address].    Your next appointment: 1  year  The format for your next appointment:   In Person  Provider:   Norman Herrlich, MD   Other Instructions

## 2020-10-18 NOTE — Addendum Note (Signed)
Addended by: Smiley Houseman B on: 10/18/2020 03:11 PM   Modules accepted: Orders

## 2020-11-17 ENCOUNTER — Ambulatory Visit (HOSPITAL_BASED_OUTPATIENT_CLINIC_OR_DEPARTMENT_OTHER)
Admission: RE | Admit: 2020-11-17 | Discharge: 2020-11-17 | Disposition: A | Discharge status: Home / Self Care | Payer: Medicaid Other | Source: Ambulatory Visit | Attending: Cardiology | Admitting: Cardiology

## 2020-11-17 ENCOUNTER — Other Ambulatory Visit: Payer: Self-pay

## 2020-11-17 DIAGNOSIS — I428 Other cardiomyopathies: Secondary | ICD-10-CM | POA: Insufficient documentation

## 2020-11-17 LAB — ECHOCARDIOGRAM COMPLETE
Area-P 1/2: 3.16 cm2
S' Lateral: 1.79 cm

## 2020-11-20 ENCOUNTER — Telehealth: Payer: Self-pay

## 2020-11-20 NOTE — Telephone Encounter (Signed)
-----   Message from Baldo Daub, MD sent at 11/17/2020  8:47 PM EDT ----- Stable result function remains normal and no thrombus present in the cardiac chamber

## 2020-11-20 NOTE — Telephone Encounter (Signed)
Spoke with patient regarding results and recommendation.  Patient verbalizes understanding and is agreeable to plan of care. Advised patient to call back with any issues or concerns.  

## 2021-10-29 ENCOUNTER — Ambulatory Visit: Payer: Medicaid Other | Admitting: Cardiology
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
## 2021-12-18 ENCOUNTER — Other Ambulatory Visit (HOSPITAL_BASED_OUTPATIENT_CLINIC_OR_DEPARTMENT_OTHER): Payer: Medicaid Other

## 2021-12-18 ENCOUNTER — Encounter: Payer: Self-pay | Admitting: Cardiology

## 2021-12-18 ENCOUNTER — Ambulatory Visit (INDEPENDENT_AMBULATORY_CARE_PROVIDER_SITE_OTHER): Payer: Medicaid Other | Admitting: Cardiology

## 2021-12-18 VITALS — BP 122/64 | HR 78 | Ht 62.0 in | Wt 97.0 lb

## 2021-12-18 DIAGNOSIS — I428 Other cardiomyopathies: Secondary | ICD-10-CM | POA: Diagnosis not present

## 2021-12-18 NOTE — Patient Instructions (Signed)
Medication Instructions:  ?Your physician recommends that you continue on your current medications as directed. Please refer to the Current Medication list given to you today. ? ?*If you need a refill on your cardiac medications before your next appointment, please call your pharmacy* ? ? ?Lab Work: ?None ?If you have labs (blood work) drawn today and your tests are completely normal, you will receive your results only by: ?MyChart Message (if you have MyChart) OR ?A paper copy in the mail ?If you have any lab test that is abnormal or we need to change your treatment, we will call you to review the results. ? ? ?Testing/Procedures: ?Your physician has requested that you have an echocardiogram. Echocardiography is a painless test that uses sound waves to create images of your heart. It provides your doctor with information about the size and shape of your heart and how well your heart?s chambers and valves are working. This procedure takes approximately one hour. There are no restrictions for this procedure. ? ? ? ?Follow-Up: ?At [facility], you and your health needs are our priority.  As part of our continuing mission to provide you with exceptional heart care, we have created designated Provider Care Teams.  These Care Teams include your primary Cardiologist (physician) and Advanced Practice Providers (APPs -  Physician Assistants and Nurse Practitioners) who all work together to provide you with the care you need, when you need it. ? ?We recommend signing up for the patient portal called "MyChart".  Sign up information is provided on this After Visit Summary.  MyChart is used to connect with patients for Virtual Visits (Telemedicine).  Patients are able to view lab/test results, encounter notes, upcoming appointments, etc.  Non-urgent messages can be sent to your provider as well.   ?To learn more about what you can do with MyChart, go to [email or web address].   ? ?Your next appointment:   ?1 year(s) ? ?The  format for your next appointment:   ?In Person ? ?Provider:   ?Brian Munley, MD  ? ? ?Other Instructions ?None ? ?Important Information About Sugar ? ? ? ? ? ? ?

## 2021-12-18 NOTE — Addendum Note (Signed)
Addended by: Roxanne Mins I on: 12/18/2021 05:08 PM ? ? Modules accepted: Orders ? ?

## 2021-12-26 ENCOUNTER — Ambulatory Visit (HOSPITAL_BASED_OUTPATIENT_CLINIC_OR_DEPARTMENT_OTHER): Admission: RE | Admit: 2021-12-26 | Payer: Medicaid Other | Source: Ambulatory Visit

## 2022-01-18 ENCOUNTER — Ambulatory Visit (HOSPITAL_BASED_OUTPATIENT_CLINIC_OR_DEPARTMENT_OTHER)
Admission: RE | Admit: 2022-01-18 | Discharge: 2022-01-18 | Disposition: A | Discharge status: Home / Self Care | Payer: Medicaid Other | Source: Ambulatory Visit | Attending: Cardiology | Admitting: Cardiology

## 2022-01-18 DIAGNOSIS — I428 Other cardiomyopathies: Secondary | ICD-10-CM | POA: Diagnosis not present

## 2022-01-18 LAB — ECHOCARDIOGRAM COMPLETE
AR max vel: 2.16 cm2
AV Area VTI: 2.11 cm2
AV Area mean vel: 1.99 cm2
AV Mean grad: 2 mmHg
AV Peak grad: 4.2 mmHg
Ao pk vel: 1.03 m/s
Area-P 1/2: 4.57 cm2
S' Lateral: 2.3 cm

## 2022-01-18 NOTE — Progress Notes (Signed)
  Echocardiogram 2D Echocardiogram has been performed.  Roosvelt Maser F 01/18/2022, 11:21 AM

## 2022-01-22 ENCOUNTER — Encounter: Payer: Self-pay | Admitting: *Deleted

## 2023-01-29 ENCOUNTER — Telehealth: Payer: Self-pay

## 2023-01-29 NOTE — Telephone Encounter (Signed)
Labs collected 01/28/2023 received from patients pcp. Forward to Dr Dulce Sellar for review
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
## 2023-05-15 ENCOUNTER — Ambulatory Visit: Payer: MEDICAID | Admitting: Cardiology

## 2023-05-15 ENCOUNTER — Encounter: Payer: Self-pay | Admitting: Cardiology

## 2023-05-15 ENCOUNTER — Ambulatory Visit: Payer: MEDICAID | Attending: Cardiology | Admitting: Cardiology

## 2023-05-15 VITALS — BP 102/62 | HR 60 | Ht 64.0 in | Wt 93.1 lb

## 2023-05-15 DIAGNOSIS — R011 Cardiac murmur, unspecified: Secondary | ICD-10-CM

## 2023-05-15 DIAGNOSIS — I428 Other cardiomyopathies: Secondary | ICD-10-CM | POA: Diagnosis not present

## 2023-05-15 NOTE — Patient Instructions (Signed)
Medication Instructions:  Your physician recommends that you continue on your current medications as directed. Please refer to the Current Medication list given to you today.  *If you need a refill on your cardiac medications before your next appointment, please call your pharmacy*   Lab Work: None ordered If you have labs (blood work) drawn today and your tests are completely normal, you will receive your results only by: MyChart Message (if you have MyChart) OR A paper copy in the mail If you have any lab test that is abnormal or we need to change your treatment, we will call you to review the results.   Testing/Procedures: Your physician has requested that you have an echocardiogram. Echocardiography is a painless test that uses sound waves to create images of your heart. It provides your doctor with information about the size and shape of your heart and how well your heart's chambers and valves are working. This procedure takes approximately one hour. There are no restrictions for this procedure. Please do NOT wear cologne, perfume, aftershave, or lotions (deodorant is allowed). Please arrive 15 minutes prior to your appointment time.     Follow-Up: At [facility], you and your health needs are our priority.  As part of our continuing mission to provide you with exceptional heart care, we have created designated Provider Care Teams.  These Care Teams include your primary Cardiologist (physician) and Advanced Practice Providers (APPs -  Physician Assistants and Nurse Practitioners) who all work together to provide you with the care you need, when you need it.  We recommend signing up for the patient portal called "MyChart".  Sign up information is provided on this After Visit Summary.  MyChart is used to connect with patients for Virtual Visits (Telemedicine).  Patients are able to view lab/test results, encounter notes, upcoming appointments, etc.  Non-urgent messages can be sent to  your provider as well.   To learn more about what you can do with MyChart, go to [email or web address].    Your next appointment:   12 month(s)  The format for your next appointment:   In Person  Provider:   Belva Crome, MD   Other Instructions Echocardiogram An echocardiogram is a test that uses sound waves (ultrasound) to produce images of the heart. Images from an echocardiogram can provide important information about: Heart size and shape. The size and thickness and movement of your heart's walls. Heart muscle function and strength. Heart valve function or if you have stenosis. Stenosis is when the heart valves are too narrow. If blood is flowing backward through the heart valves (regurgitation). A tumor or infectious growth around the heart valves. Areas of heart muscle that are not working well because of poor blood flow or injury from a heart attack. Aneurysm detection. An aneurysm is a weak or damaged part of an artery wall. The wall bulges out from the normal force of blood pumping through the body. Tell a health care provider about: Any allergies you have. All medicines you are taking, including vitamins, herbs, eye drops, creams, and over-the-counter medicines. Any blood disorders you have. Any surgeries you have had. Any medical conditions you have. Whether you are pregnant or may be pregnant. What are the risks? Generally, this is a safe test. However, problems may occur, including an allergic reaction to dye (contrast) that may be used during the test. What happens before the test? No specific preparation is needed. You may eat and drink normally. What happens during the test? You will  take off your clothes from the waist up and put on a hospital gown. Electrodes or electrocardiogram (ECG)patches may be placed on your chest. The electrodes or patches are then connected to a device that monitors your heart rate and rhythm. You will lie down on a table for an  ultrasound exam. A gel will be applied to your chest to help sound waves pass through your skin. A handheld device, called a transducer, will be pressed against your chest and moved over your heart. The transducer produces sound waves that travel to your heart and bounce back (or "echo" back) to the transducer. These sound waves will be captured in real-time and changed into images of your heart that can be viewed on a video monitor. The images will be recorded on a computer and reviewed by your health care provider. You may be asked to change positions or hold your breath for a short time. This makes it easier to get different views or better views of your heart. In some cases, you may receive contrast through an IV in one of your veins. This can improve the quality of the pictures from your heart. The procedure may vary among health care providers and hospitals.   What can I expect after the test? You may return to your normal, everyday life, including diet, activities, and medicines, unless your health care provider tells you not to do that. Follow these instructions at home: It is up to you to get the results of your test. Ask your health care provider, or the department that is doing the test, when your results will be ready. Keep all follow-up visits. This is important. Summary An echocardiogram is a test that uses sound waves (ultrasound) to produce images of the heart. Images from an echocardiogram can provide important information about the size and shape of your heart, heart muscle function, heart valve function, and other possible heart problems. You do not need to do anything to prepare before this test. You may eat and drink normally. After the echocardiogram is completed, you may return to your normal, everyday life, unless your health care provider tells you not to do that. This information is not intended to replace advice given to you by your health care provider. Make sure you  discuss any questions you have with your health care provider. Document Revised: 03/28/2020 Document Reviewed: 03/28/2020 Elsevier Patient Education  2021 Elsevier Inc.   Important Information About Sugar
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
## 2023-05-27 ENCOUNTER — Ambulatory Visit: Payer: MEDICAID | Attending: Cardiology

## 2023-05-27 DIAGNOSIS — I428 Other cardiomyopathies: Secondary | ICD-10-CM | POA: Diagnosis not present

## 2023-05-27 LAB — ECHOCARDIOGRAM COMPLETE: S' Lateral: 2.4 cm

## 2023-05-28 ENCOUNTER — Telehealth: Payer: Self-pay

## 2023-05-28 NOTE — Telephone Encounter (Signed)
Left vm to return call.    

## 2023-05-28 NOTE — Telephone Encounter (Signed)
-----   Message from Aundra Dubin Revankar sent at 05/28/2023  9:40 AM EDT ----- The results of the study is unremarkable. Please inform patient. I will discuss in detail at next appointment. Cc  primary care/referring physician Garwin Brothers, MD 05/28/2023 9:40 AM

## 2024-06-16 ENCOUNTER — Telehealth: Payer: Self-pay | Admitting: Cardiology

## 2024-06-16 NOTE — Telephone Encounter (Signed)
 Pt mother Jethro Pollack) called requesting to add to [facility] and was wondering if there is a way without going to the office due to not being close to a office please advise

## 2024-06-16 NOTE — Telephone Encounter (Signed)
 Mom said pt has moved to a group home in [place] and wants to know if you can refer him to a Cardiologists there. Please advise

## 2024-06-17 NOTE — Telephone Encounter (Signed)
Spoke with patient mother.

## 2024-06-21 NOTE — Telephone Encounter (Signed)
 Spoke with Johnna and confirmed that the patient's mother Avelina Pollack was finished updating the DPR and she had no further updates at this time.

## 2024-06-21 NOTE — Telephone Encounter (Signed)
 Left message for the patient's mother Avelina to call back.

## 2024-06-22 NOTE — Telephone Encounter (Signed)
 Called Ruben Mack the patient's mother and informed her that  Dr. Monetta referred her son to [facility] for cardiac care down in [place], [place] since the patient was living in [place] currently. Patient's mother verbalized understanding and had no further questions at this time.

## 2024-07-13 ENCOUNTER — Ambulatory Visit: Payer: MEDICAID | Admitting: Cardiology
